# Patient Record
Sex: Female | Born: 1960 | Race: White | Hispanic: No | Marital: Married | Smoking: Current every day smoker
Health system: Southern US, Community
[De-identification: ages and names within clinical notes are randomized; demographics above are authoritative.]

## PROBLEM LIST (undated history)

## (undated) DIAGNOSIS — I10 Essential (primary) hypertension: Secondary | ICD-10-CM

## (undated) DIAGNOSIS — J302 Other seasonal allergic rhinitis: Secondary | ICD-10-CM

## (undated) DIAGNOSIS — K635 Polyp of colon: Secondary | ICD-10-CM

## (undated) DIAGNOSIS — K219 Gastro-esophageal reflux disease without esophagitis: Secondary | ICD-10-CM

## (undated) DIAGNOSIS — E039 Hypothyroidism, unspecified: Secondary | ICD-10-CM

## (undated) DIAGNOSIS — E079 Disorder of thyroid, unspecified: Secondary | ICD-10-CM

## (undated) HISTORY — PX: TUBAL LIGATION: SHX77

## (undated) HISTORY — PX: NECK SURGERY: SHX720

## (undated) HISTORY — DX: Disorder of thyroid, unspecified: E07.9

## (undated) HISTORY — PX: STOMACH SURGERY: SHX791

## (undated) HISTORY — PX: MIDDLE EAR SURGERY: SHX713

## (undated) HISTORY — PX: TONSILLECTOMY: SUR1361

## (undated) HISTORY — PX: MULTIPLE TOOTH EXTRACTIONS: SHX2053

## (undated) HISTORY — DX: Polyp of colon: K63.5

## (undated) HISTORY — PX: OTHER SURGICAL HISTORY: SHX169

## (undated) HISTORY — DX: Gastro-esophageal reflux disease without esophagitis: K21.9

---

## 2001-03-08 ENCOUNTER — Encounter: Payer: Self-pay | Admitting: Neurosurgery

## 2001-03-10 ENCOUNTER — Ambulatory Visit (HOSPITAL_COMMUNITY): Admission: RE | Admit: 2001-03-10 | Discharge: 2001-03-10 | Payer: Self-pay | Admitting: Neurosurgery

## 2001-03-10 ENCOUNTER — Encounter: Payer: Self-pay | Admitting: Neurosurgery

## 2001-04-18 ENCOUNTER — Ambulatory Visit (HOSPITAL_COMMUNITY): Admission: RE | Admit: 2001-04-18 | Discharge: 2001-04-18 | Payer: Self-pay | Admitting: Neurosurgery

## 2001-04-18 ENCOUNTER — Encounter: Payer: Self-pay | Admitting: Neurosurgery

## 2002-07-06 HISTORY — PX: CHOLECYSTECTOMY: SHX55

## 2002-08-24 ENCOUNTER — Ambulatory Visit (HOSPITAL_COMMUNITY): Admission: RE | Admit: 2002-08-24 | Discharge: 2002-08-24 | Payer: Self-pay | Admitting: Neurosurgery

## 2002-08-24 ENCOUNTER — Encounter: Payer: Self-pay | Admitting: Neurosurgery

## 2004-02-12 ENCOUNTER — Other Ambulatory Visit: Payer: Self-pay

## 2004-04-07 ENCOUNTER — Emergency Department: Payer: Self-pay | Admitting: Emergency Medicine

## 2004-05-13 ENCOUNTER — Ambulatory Visit: Payer: Self-pay | Admitting: Emergency Medicine

## 2008-01-02 ENCOUNTER — Ambulatory Visit: Payer: Self-pay

## 2008-01-02 ENCOUNTER — Other Ambulatory Visit: Payer: Self-pay

## 2009-07-22 ENCOUNTER — Ambulatory Visit: Payer: Self-pay | Admitting: Unknown Physician Specialty

## 2013-04-06 ENCOUNTER — Encounter: Payer: Self-pay | Admitting: Gastroenterology

## 2013-05-08 ENCOUNTER — Ambulatory Visit: Payer: Self-pay | Admitting: Gastroenterology

## 2013-08-06 HISTORY — PX: COLONOSCOPY: SHX174

## 2013-09-28 ENCOUNTER — Ambulatory Visit: Payer: Self-pay | Admitting: Unknown Physician Specialty

## 2013-11-07 ENCOUNTER — Ambulatory Visit: Payer: Self-pay | Admitting: Gastroenterology

## 2013-11-30 DIAGNOSIS — R198 Other specified symptoms and signs involving the digestive system and abdomen: Secondary | ICD-10-CM | POA: Insufficient documentation

## 2014-01-17 ENCOUNTER — Ambulatory Visit (INDEPENDENT_AMBULATORY_CARE_PROVIDER_SITE_OTHER): Payer: PRIVATE HEALTH INSURANCE | Admitting: General Surgery

## 2014-01-17 ENCOUNTER — Encounter: Payer: Self-pay | Admitting: General Surgery

## 2014-01-17 VITALS — BP 132/80 | HR 88 | Resp 12 | Ht 67.0 in | Wt 140.0 lb

## 2014-01-17 DIAGNOSIS — G8929 Other chronic pain: Secondary | ICD-10-CM

## 2014-01-17 DIAGNOSIS — R634 Abnormal weight loss: Secondary | ICD-10-CM

## 2014-01-17 DIAGNOSIS — R1011 Right upper quadrant pain: Secondary | ICD-10-CM

## 2014-01-17 NOTE — Patient Instructions (Addendum)
Patient to bring disc for Dr. Bary Castilla to review.   Patient has been scheduled for a UGI/SBFT at [facility] for 01-24-14 at 10:30 am (arrive 10 am). Prep: NPO after midnight. She is aware of date, time, and instructions.   This patient's surgery has been scheduled for 02-08-14 at [facility].

## 2014-01-17 NOTE — Progress Notes (Signed)
Patient ID: Tammy Mccall, female   DOB: [birth year], 53 y.o.   MRN: [id number]  Chief Complaint  Patient presents with  . Abdominal Pain    HPI Tammy Mccall is a 53 y.o. female here today for evaluation of right upper quadrant pain. She states this has been going on for six months now. Patient states is pain is constant. No appetite.Patient was seen in the ER at [facility] 12/30/13 had x-rays, ct of the abdomen and labs done. Patient states she has lost 70 pounds in the 6 months.   The patient was evaluated by Gaylyn Cheers, M.D. in March 2015. Results of that evaluation available today. Abdominal Pain    Past Medical History  Diagnosis Date  . Thyroid disease   . GERD (gastroesophageal reflux disease)     Past Surgical History  Procedure Laterality Date  . Cholecystectomy  2004  . Cesarean section  1990's  . Neck surgery    . Colonoscopy  08/2013  . Stomach surgery      No family history on file.  Social History History  Substance Use Topics  . Smoking status: Current Every Day Smoker -- 1.00 packs/day for 40 years    Types: Cigarettes  . Smokeless tobacco: Never Used  . Alcohol Use: Yes    No Known Allergies  Current Outpatient Prescriptions  Medication Sig Dispense Refill  . aluminum-magnesium hydroxide-simethicone (MAALOX) 034-742-59 MG/5ML SUSP Take by mouth.      . hyoscyamine (LEVSIN, ANASPAZ) 0.125 MG tablet Place 0.125 mg under the tongue every 4 (four) hours as needed.       Marland Kitchen levothyroxine (SYNTHROID, LEVOTHROID) 112 MCG tablet Take 112 mcg by mouth daily before breakfast.       No current facility-administered medications for this visit.    Review of Systems Review of Systems  Constitutional: Negative.  Negative for appetite change.  Respiratory: Negative.   Cardiovascular: Negative.   Gastrointestinal: Positive for abdominal pain. Negative for blood in stool, abdominal distention, anal bleeding and rectal pain.    Blood  pressure 132/80, pulse 88, resp. rate 12, height 5\' 7"  (1.702 m), weight 140 lb (63.504 kg). The patient's weight is down 22 pounds from her 2000 for evaluation of this office. Physical Exam Physical Exam  Constitutional: She is oriented to person, place, and time. She appears well-developed and well-nourished.  Eyes: Conjunctivae are normal. No scleral icterus.  Neck: Neck supple.  Cardiovascular: Normal rate, regular rhythm and normal heart sounds.   Pulmonary/Chest: Effort normal and breath sounds normal.  Abdominal: Soft. Normal appearance and bowel sounds are normal. There is tenderness in the right upper quadrant.  Neurological: She is alert and oriented to person, place, and time.  Skin: Skin is warm and dry.    Data Reviewed Plain films of the abdomen completed at [facility] on 12/30/2013 reported a normal bowel gas pattern but a 2.1 cm metallic density in the right lower quadrant. Bilateral occlusion devices for the fallopian tubes were identified. Chest x-ray the same date was unremarkable. ( Resolution clip placed at the time of colonoscopy).   REASON FOR EXAM:    abd pain RUQ epigastric weight loss COMMENTS:     PROCEDURE: KCT - KCT ABDOMEN/PELVIS W  - Sep 28 2013  3:03PM   CLINICAL DATA:  Right upper quadrant and epigastric pain, 30 pound weight loss in 6 weeks, prior esophageal surgery for GERD, cholecystectomy  EXAM: CT ABDOMEN AND PELVIS WITH CONTRAST  TECHNIQUE: Multidetector CT imaging  of the abdomen and pelvis was performed using the standard protocol following bolus administration of intravenous contrast. Sagittal and coronal MPR images reconstructed from axial data set. Patient extravasation of a small amount of contrast material after injection of 45 cc. The injection was terminated. Following replacement of the IV catheter, remaining contrast was administered.  CONTRAST:  Dilute oral contrast.  125 cc Isovue 370 IV.  COMPARISON:   None  FINDINGS: Minimal atelectasis at lung bases.  Mild fatty infiltration of liver. Gallbladder surgically absent.  Liver, spleen, pancreas, and kidneys normal appearance.  Bilateral diffuse thickening of the adrenal glands greater on right without definite discrete mass.  Normal size appendix containing a tiny at appendicolith.  Unremarkable uterus and ovaries with evidence of prior tubal ligation.  Normal appearing bladder and ureters.  Colon unopacified, suboptimally assessed, particularly right colon/ileocecal valve region.  Remaining bowel loops unremarkable.  Minimal thickening at gastroesophageal junction question related to prior anti reflux procedure.  No mass, adenopathy, free fluid or inflammatory process.  Scattered atherosclerotic calcifications.  No hernia or acute bone lesions.   IMPRESSION: No acute intra-abdominal or intrapelvic abnormalities. Bilateral low-attenuation adrenal gland thickening question hyperplasia without discrete mass.  Mild fatty infiltration of liver.  Ultrasound the abdomen dated 07/22/2009 completed the right upper quadrant pain showed a common bile duct measuring 4.3 cm, no evidence of intrahepatic biliary ductal dilatation, no choledocholithiasis, evidence of previous cholecystectomy   PCP notes from May-July 2015 reviewed.   Assessment    Abdominal pain, weight loss.     Plan    It's difficult to explain her weight loss and symptoms based on her clinical exam. The CT scan completed earlier this year did not show a proximal bowel dilatation. He would be an atypical presentation for scar tissue to cause massive weight loss without any bowel wall thickening.  Will attempt to locate all the burden records and consultation notes. A diagnostic laparoscopy has been offered with the idea that this may solve the issues of occult adhesions.       Patient has been scheduled for a UGI/SBFT at [facility] for 01-24-14 at 10:30 am  (arrive 10 am). Prep: NPO after midnight. She is aware of date, time, and instructions.   This patient's surgery has been scheduled for 02-08-14 at [facility].   PCP: Alexandria Lodge 01/18/2014, 4:33 PM

## 2014-01-18 ENCOUNTER — Encounter: Payer: Self-pay | Admitting: *Deleted

## 2014-01-18 DIAGNOSIS — R1011 Right upper quadrant pain: Principal | ICD-10-CM

## 2014-01-18 DIAGNOSIS — R634 Abnormal weight loss: Secondary | ICD-10-CM | POA: Insufficient documentation

## 2014-01-18 DIAGNOSIS — G8929 Other chronic pain: Secondary | ICD-10-CM | POA: Insufficient documentation

## 2014-01-24 ENCOUNTER — Ambulatory Visit: Payer: Self-pay | Admitting: General Surgery

## 2014-01-24 ENCOUNTER — Other Ambulatory Visit: Payer: Self-pay | Admitting: General Surgery

## 2014-01-24 ENCOUNTER — Encounter: Payer: Self-pay | Admitting: General Surgery

## 2014-01-24 DIAGNOSIS — R1011 Right upper quadrant pain: Principal | ICD-10-CM

## 2014-01-24 DIAGNOSIS — G8929 Other chronic pain: Secondary | ICD-10-CM

## 2014-01-31 ENCOUNTER — Telehealth: Payer: Self-pay

## 2014-01-31 NOTE — Telephone Encounter (Signed)
Notified patient as instructed, patient pleased. Discussed upcoming surgical procedure, patient agrees.

## 2014-01-31 NOTE — Telephone Encounter (Signed)
Message copied by Lesly Rubenstein on Wed Jan 31, 2014  8:20 AM ------      Message from: [place], Forest Gleason      Created: Tue Jan 30, 2014  8:39 PM       Notify patient UGI did not show any blockage.  Awaiting records from outside facilities.      Confirm the only intra-abdominal procedures were gallbladder removal and tubal ligation. Thanks.      ----- Message -----         From: Darrin Nipper, CMA         Sent: 01/24/2014   2:32 PM           To: Robert Bellow, MD                   ------

## 2014-02-08 ENCOUNTER — Ambulatory Visit: Payer: Self-pay | Admitting: General Surgery

## 2014-02-08 DIAGNOSIS — K660 Peritoneal adhesions (postprocedural) (postinfection): Secondary | ICD-10-CM

## 2014-02-12 ENCOUNTER — Encounter: Payer: Self-pay | Admitting: General Surgery

## 2014-02-13 ENCOUNTER — Telehealth: Payer: Self-pay | Admitting: *Deleted

## 2014-02-13 NOTE — Telephone Encounter (Signed)
I talked with the patient and encouraged her to use a heating pad. She was asking if she could walk around the house, advised that it was fine. She has been taking a stool softeners but stopped and she is going to start taking them again. appt is for 02-14-14 with Dr Bary Castilla.

## 2014-02-13 NOTE — Telephone Encounter (Signed)
Pt called and stated that she is still having pain from surgery on Thursday, she doesn't really want to take any pain medication, so she was just wondering what else could she do.

## 2014-02-14 ENCOUNTER — Ambulatory Visit (INDEPENDENT_AMBULATORY_CARE_PROVIDER_SITE_OTHER): Payer: Self-pay | Admitting: General Surgery

## 2014-02-14 ENCOUNTER — Encounter: Payer: Self-pay | Admitting: General Surgery

## 2014-02-14 VITALS — BP 120/78 | HR 72 | Resp 12 | Ht 67.0 in | Wt 135.0 lb

## 2014-02-14 DIAGNOSIS — R1011 Right upper quadrant pain: Secondary | ICD-10-CM

## 2014-02-14 DIAGNOSIS — G8929 Other chronic pain: Secondary | ICD-10-CM

## 2014-02-14 NOTE — Progress Notes (Signed)
Patient ID: Tammy Mccall, female   DOB: [birth year], 53 y.o.   MRN: [id number]  Chief Complaint  Patient presents with  . Routine Post Op    Laparoscopy and Lysis of Adhesions    HPI Tammy Mccall is a 53 y.o. female here today for her post op Laparoscopy and lysis of adhesions done on 02/08/14. Patient states she is doing well. Just still sore.   HPI  Past Medical History  Diagnosis Date  . Thyroid disease   . GERD (gastroesophageal reflux disease)     Past Surgical History  Procedure Laterality Date  . Cholecystectomy  2004  . Cesarean section  1990's  . Neck surgery    . Colonoscopy  08/2013  . Stomach surgery      No family history on file.  Social History History  Substance Use Topics  . Smoking status: Current Every Day Smoker -- 1.00 packs/day for 40 years    Types: Cigarettes  . Smokeless tobacco: Never Used  . Alcohol Use: Yes    No Known Allergies  Current Outpatient Prescriptions  Medication Sig Dispense Refill  . aluminum-magnesium hydroxide-simethicone (MAALOX) 211-155-20 MG/5ML SUSP Take by mouth.      . hyoscyamine (LEVSIN, ANASPAZ) 0.125 MG tablet Place 0.125 mg under the tongue every 4 (four) hours as needed.       Marland Kitchen levothyroxine (SYNTHROID, LEVOTHROID) 112 MCG tablet Take 112 mcg by mouth daily before breakfast.       No current facility-administered medications for this visit.    Review of Systems Review of Systems  Constitutional: Negative.   Respiratory: Negative.   Cardiovascular: Negative.     Blood pressure 120/78, pulse 72, resp. rate 12, height 5\' 7"  (1.702 m), weight 135 lb (61.236 kg). The patient's weight is down 5 pounds from prior visits. Physical Exam Physical Exam  Constitutional: She is oriented to person, place, and time. She appears well-developed and well-nourished.  Eyes: Conjunctivae are normal. No scleral icterus.  Neck: Neck supple.  Cardiovascular: Normal rate, regular rhythm and normal heart  sounds.   Pulmonary/Chest: Effort normal and breath sounds normal.  Abdominal: Soft. Normal appearance and bowel sounds are normal. There is no tenderness.  Port site are clean and healing well.   Neurological: She is alert and oriented to person, place, and time.  Skin: Skin is warm and dry.    Data Reviewed Intraoperative examination shows the abdomen to be free of adhesions except for a thin band from the omentum to the gallbladder fossa. These were taken down with cautery dissection.  Assessment    Chronic abdominal pain with weight loss. Focal area of intra-abdominal adhesions corresponding to the area of symptoms.     Plan    The patient will be increasing her diet as tolerated. We'll hopefully see improvement in her dietary tolerance (already better by family report).      PCP: Eliberto Ivory 02/15/2014, 9:22 PM

## 2014-02-14 NOTE — Patient Instructions (Signed)
The patient is aware to use an antiinflammatory of choice (Advil or Aleve) as needed for comfort. The patient is aware to use a heating pad as needed for comfort.

## 2014-02-20 ENCOUNTER — Telehealth: Payer: Self-pay | Admitting: *Deleted

## 2014-02-20 NOTE — Telephone Encounter (Signed)
The patient can discontinue the hyoscyamine.

## 2014-02-20 NOTE — Telephone Encounter (Signed)
Please schedule a appt for weight check at the end of the month

## 2014-02-20 NOTE — Telephone Encounter (Signed)
Pt called and was wondering does she need to keep taking the medication Hyoscyamine? She stated she seems to be doing better and is there any need to still take it.

## 2014-03-05 ENCOUNTER — Ambulatory Visit (INDEPENDENT_AMBULATORY_CARE_PROVIDER_SITE_OTHER): Payer: Self-pay | Admitting: *Deleted

## 2014-03-05 VITALS — Wt 136.0 lb

## 2014-03-05 DIAGNOSIS — R634 Abnormal weight loss: Secondary | ICD-10-CM

## 2014-03-05 NOTE — Progress Notes (Signed)
Weight up one pound.

## 2014-03-07 ENCOUNTER — Encounter: Payer: Self-pay | Admitting: General Surgery

## 2014-03-20 ENCOUNTER — Encounter: Payer: Self-pay | Admitting: General Surgery

## 2014-05-07 ENCOUNTER — Encounter: Payer: Self-pay | Admitting: General Surgery

## 2014-06-05 ENCOUNTER — Ambulatory Visit (INDEPENDENT_AMBULATORY_CARE_PROVIDER_SITE_OTHER): Payer: PRIVATE HEALTH INSURANCE | Admitting: General Surgery

## 2014-06-05 ENCOUNTER — Encounter: Payer: Self-pay | Admitting: General Surgery

## 2014-06-05 VITALS — BP 116/82 | HR 80 | Resp 18 | Ht 67.0 in | Wt 142.0 lb

## 2014-06-05 DIAGNOSIS — R1010 Upper abdominal pain, unspecified: Secondary | ICD-10-CM

## 2014-06-05 DIAGNOSIS — G8929 Other chronic pain: Secondary | ICD-10-CM

## 2014-06-05 DIAGNOSIS — R1011 Right upper quadrant pain: Principal | ICD-10-CM

## 2014-06-05 NOTE — Patient Instructions (Signed)
Patient advised to measure the amount of Maalox she takes versus sipping it. Patient to return as needed.

## 2014-06-05 NOTE — Progress Notes (Signed)
Patient ID: Atlas Kuc, female   DOB: [birth year], 53 y.o.   MRN: [id number]  Chief Complaint  Patient presents with  . Abdominal Pain    HPI Enyla Lisbon is a 53 y.o. female here today for a evaluation of abdominal pain. She states the pain is in her upper right quadrant. Patient had  Laparoscopy and lysis of adhesions done on 02/08/14. The patient states she has bloating after eating as well a gas. She also states she has had diarrhea for 9 months. She has 3-4 loose bowel movements that are yellow in color. If she eats she is more likely to move her bowels.  In spite of the reported frequency of stools after meals, her weight is up 7 pounds from her exam 3 months ago.  The severe pain in the right upper quadrant she was experiencing prior to her diagnostic laparoscopy and lysis of an isolated adhesion in the bed of the gallbladder has resolved. HPI  Past Medical History  Diagnosis Date  . Thyroid disease   . GERD (gastroesophageal reflux disease)     Past Surgical History  Procedure Laterality Date  . Cholecystectomy  2004  . Cesarean section  1990's  . Neck surgery    . Colonoscopy  08/2013  . Stomach surgery      No family history on file.  Social History History  Substance Use Topics  . Smoking status: Current Every Day Smoker -- 1.00 packs/day for 40 years    Types: Cigarettes  . Smokeless tobacco: Never Used  . Alcohol Use: 0.0 oz/week    0 Not specified per week    No Known Allergies  Current Outpatient Prescriptions  Medication Sig Dispense Refill  . aluminum-magnesium hydroxide-simethicone (MAALOX) 388-828-00 MG/5ML SUSP Take by mouth.    . hyoscyamine (LEVSIN, ANASPAZ) 0.125 MG tablet Place 0.125 mg under the tongue every 4 (four) hours as needed.     Marland Kitchen levothyroxine (SYNTHROID, LEVOTHROID) 112 MCG tablet Take 112 mcg by mouth daily before breakfast.    . Probiotic Product (PROBIOTIC DAILY PO) Take 1 tablet by mouth daily.     No current  facility-administered medications for this visit.    Review of Systems Review of Systems  Constitutional: Negative.   Respiratory: Negative.   Cardiovascular: Negative.   Gastrointestinal: Positive for abdominal pain and diarrhea.    Blood pressure 116/82, pulse 80, resp. rate 18, height 5\' 7"  (1.702 m), weight 142 lb (64.411 kg).  Physical Exam Physical Exam  Constitutional: She is oriented to person, place, and time. She appears well-developed and well-nourished.  Cardiovascular: Normal rate, regular rhythm and normal heart sounds.   No murmur heard. Pulmonary/Chest: Effort normal and breath sounds normal.  Abdominal: Soft. Normal appearance and bowel sounds are normal. There is no hepatosplenomegaly. There is no tenderness.  Neurological: She is alert and oriented to person, place, and time.  Skin: Skin is warm and dry.    Data Reviewed No new data.  Assessment    Loose stools secondary to magnesium based antacids.    Plan    The patient was discouraged from taking and "swig" of Maalox before and after every meal. I think this will help resolve her dietary/postprandial stool frequency.   PCP:  Truitt Leep 06/06/2014, 9:09 PM

## 2014-10-27 NOTE — Op Note (Signed)
PATIENT NAME:  Tammy Mccall, Tammy Mccall MR#:  [id number] DATE OF BIRTH:  06-07-1961  DATE OF PROCEDURE:  02/08/2014  PREOPERATIVE DIAGNOSIS: Right upper quadrant pain with profound weight loss.   POSTOPERATIVE DIAGNOSIS: Right upper quadrant pain with profound weight loss with adhesions to the gallbladder bed.   OPERATIVE PROCEDURE: Diagnostic laparoscopy and lysis of adhesions.   SURGEON: Hervey Ard, MD.   ANESTHESIA: General endotracheal under Lorane Gell, MD.   ESTIMATED BLOOD LOSS: None.   CLINICAL NOTE: This 54 year old woman has had a 24-month history of progressive pain in the right upper quadrant associated with profound weight loss. Imaging studies to date have been negative. She was brought to the operating room for planned diagnostic laparoscopy.   OPERATIVE NOTE: With the patient under adequate general endotracheal anesthesia, the abdomen was prepped with ChloraPrep and draped. In Trendelenburg position, a Veress needle was placed through a transumbilical incision. After assuring intra-abdominal location with the hanging drop test, the abdomen was insufflated with CO2 at 10 mmHg pressure. A 5 mm step port was expanded and inspection showed no evidence of injury from initial port placement. A 30 degree, 5 mm scope was used for laparoscopy.   The abdomen was entirely free of adhesions in spite of her multiple previous abdominal procedures with the exception of a band of omental tissue that was adherent to the gallbladder bed. Images of the pelvis, terminal ileum, liver, stomach, and hypogastrium were obtained. Due to the location of the patient's pain being in the area of the only visible adhesive disease, these were taken down with cautery dissection. Good hemostasis was noted. No evidence of intestinal injury. No evidence of bowel inflammation or dilatation. Normal vasculature appreciated. The right ureter was identified.   The patient tolerated the procedure well. The abdomen was  desufflated under direct vision and ports removed. Skin incisions were closed with 4-0 Vicryl subcuticular suture. Benzoin, Steri-Strips, Telfa, and Tegaderm dressings were applied.   The patient tolerated the procedure well and was taken to the recovery room in stable condition.    ____________________________ Robert Bellow, MD jwb:nb D: 02/09/2014 08:20:16 ET T: 02/09/2014 11:30:35 ET JOB#: [id number]  cc: Robert Bellow, MD, <Dictator> Tommy Medal, MD [name] MD ELECTRONICALLY SIGNED 02/09/2014 21:18

## 2015-01-01 ENCOUNTER — Ambulatory Visit
Admission: RE | Admit: 2015-01-01 | Discharge: 2015-01-01 | Disposition: A | Discharge status: Home / Self Care | Payer: PRIVATE HEALTH INSURANCE | Source: Ambulatory Visit | Attending: Family Medicine | Admitting: Family Medicine

## 2015-01-01 ENCOUNTER — Other Ambulatory Visit: Payer: Self-pay | Admitting: Family Medicine

## 2015-01-01 DIAGNOSIS — R1031 Right lower quadrant pain: Secondary | ICD-10-CM

## 2015-01-01 DIAGNOSIS — E279 Disorder of adrenal gland, unspecified: Secondary | ICD-10-CM | POA: Diagnosis not present

## 2015-01-01 DIAGNOSIS — I700 Atherosclerosis of aorta: Secondary | ICD-10-CM | POA: Insufficient documentation

## 2015-01-01 MED ORDER — IOHEXOL 300 MG/ML  SOLN
100.0000 mL | Freq: Once | INTRAMUSCULAR | Status: AC | PRN
  Administered 2015-01-01: 100 mL via INTRAVENOUS

## 2015-06-03 ENCOUNTER — Ambulatory Visit: Payer: PRIVATE HEALTH INSURANCE | Admitting: General Surgery

## 2015-06-13 ENCOUNTER — Ambulatory Visit (INDEPENDENT_AMBULATORY_CARE_PROVIDER_SITE_OTHER): Payer: PRIVATE HEALTH INSURANCE | Admitting: General Surgery

## 2015-06-13 ENCOUNTER — Encounter: Payer: Self-pay | Admitting: General Surgery

## 2015-06-13 VITALS — BP 130/74 | HR 74 | Resp 12 | Ht 67.0 in | Wt 156.0 lb

## 2015-06-13 DIAGNOSIS — R1010 Upper abdominal pain, unspecified: Secondary | ICD-10-CM | POA: Diagnosis not present

## 2015-06-13 DIAGNOSIS — G8929 Other chronic pain: Secondary | ICD-10-CM | POA: Diagnosis not present

## 2015-06-13 DIAGNOSIS — R1011 Right upper quadrant pain: Principal | ICD-10-CM

## 2015-06-13 NOTE — Patient Instructions (Signed)
Patient to return as needed. 

## 2015-06-13 NOTE — Progress Notes (Signed)
Patient ID: Tammy Mccall, female   DOB: [birth year], 54 y.o.   MRN: [id number]  Chief Complaint  Patient presents with  . Abdominal Pain    HPI Tammy Mccall is a 54 y.o. female here today for a recheck of abdominal pain. She states the pain is in her upper right quadrant, fairly infrequent and much less severe than in the past.. Patient had Laparoscopy and lysis of adhesions done on 02/08/14.  She also states her diarrhea is better. She has daily bowel movements. No rectal bleeding. No mucus or slime in the stools.   I personally reviewed the patient's history. HPI  Past Medical History  Diagnosis Date  . Thyroid disease   . GERD (gastroesophageal reflux disease)     Past Surgical History  Procedure Laterality Date  . Cholecystectomy  2004  . Cesarean section  1990's  . Neck surgery    . Colonoscopy  08/2013  . Stomach surgery      No family history on file.  Social History Social History  Substance Use Topics  . Smoking status: Current Every Day Smoker -- 1.00 packs/day for 40 years    Types: Cigarettes  . Smokeless tobacco: Never Used  . Alcohol Use: 0.0 oz/week    0 Standard drinks or equivalent per week    No Known Allergies  Current Outpatient Prescriptions  Medication Sig Dispense Refill  . hyoscyamine (LEVSIN, ANASPAZ) 0.125 MG tablet Place 0.125 mg under the tongue every 4 (four) hours as needed.     Marland Kitchen levothyroxine (SYNTHROID, LEVOTHROID) 112 MCG tablet Take 112 mcg by mouth daily before breakfast.    . Probiotic Product (PROBIOTIC DAILY PO) Take 1 tablet by mouth daily.    Marland Kitchen aluminum-magnesium hydroxide-simethicone (MAALOX) [id number] MG/5ML SUSP Take by mouth.     No current facility-administered medications for this visit.    Review of Systems Review of Systems  Blood pressure 130/74, pulse 74, resp. rate 12, height 5\' 7"  (1.702 m), weight 156 lb (70.761 kg).  Weight at the December 2015 exam: 142 pounds.  Physical Exam Physical  Exam  Constitutional: She is oriented to person, place, and time. She appears well-developed and well-nourished.  Eyes: Conjunctivae are normal. No scleral icterus.  Neck: Neck supple.    Cardiovascular: Normal rate, regular rhythm and normal heart sounds.   Pulmonary/Chest: Effort normal and breath sounds normal.  Abdominal: Soft. Normal appearance and bowel sounds are normal.  Lymphadenopathy:    She has no cervical adenopathy.  Neurological: She is alert and oriented to person, place, and time.  Skin: Skin is warm and dry.    Data Reviewed Diagnostic laparoscopy from 2015, lysis of adhesions.   Assessment    Benign abdominal exam.  Benign facial mole.      Plan    The patient's reported intermittent discomfort have not precluded adequate weight gain or limited activities. No intervention is required at this time.   Patient to return as needed   PCP:  Truitt Leep 06/14/2015, 6:32 AM

## 2016-06-01 IMAGING — CR DG ABDOMEN ACUTE W/ 1V CHEST
1 series · 3 of 3 positions shown · non-contrast
Comparison: KUB November 07, 2013

CLINICAL DATA: Right upper and lower quadrant abdominal pain;
history of previous abdominal surgery to relieve adhesions ; history
of cholecystectomy, chronic tobacco use.

EXAM:
DG ABDOMEN ACUTE W/ 1V CHEST

[Series 1: w chest pa · 0.14mm/px · 3 of 3 slices shown]
[im 1/3]
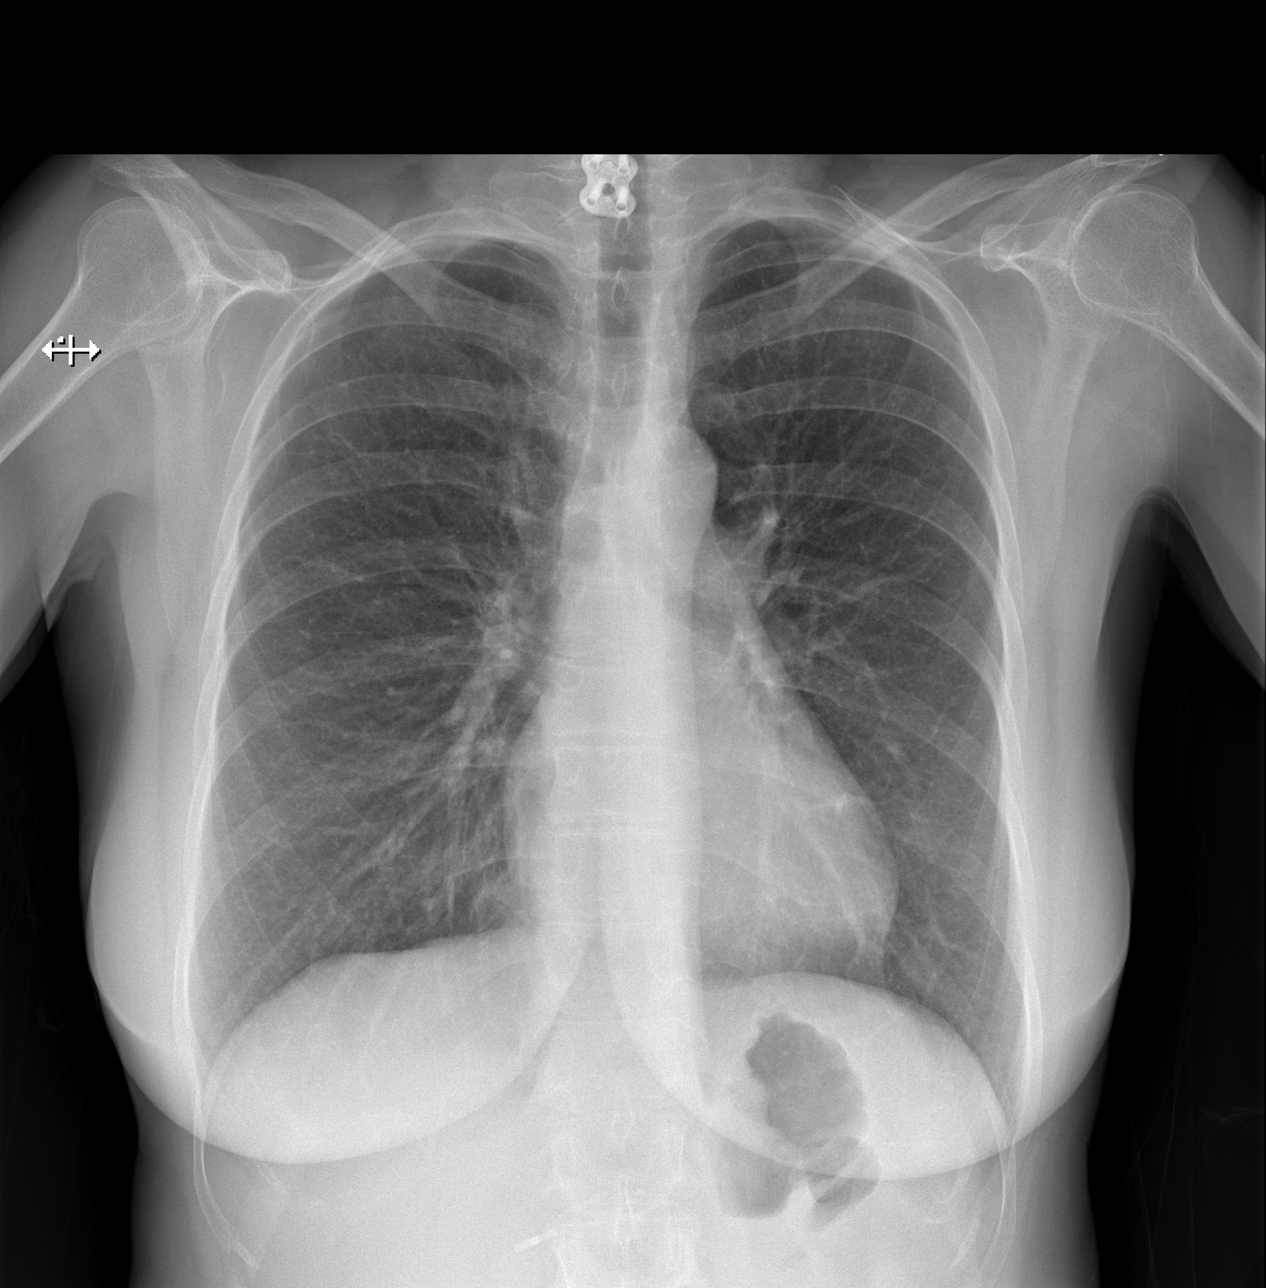
[im 2/3]
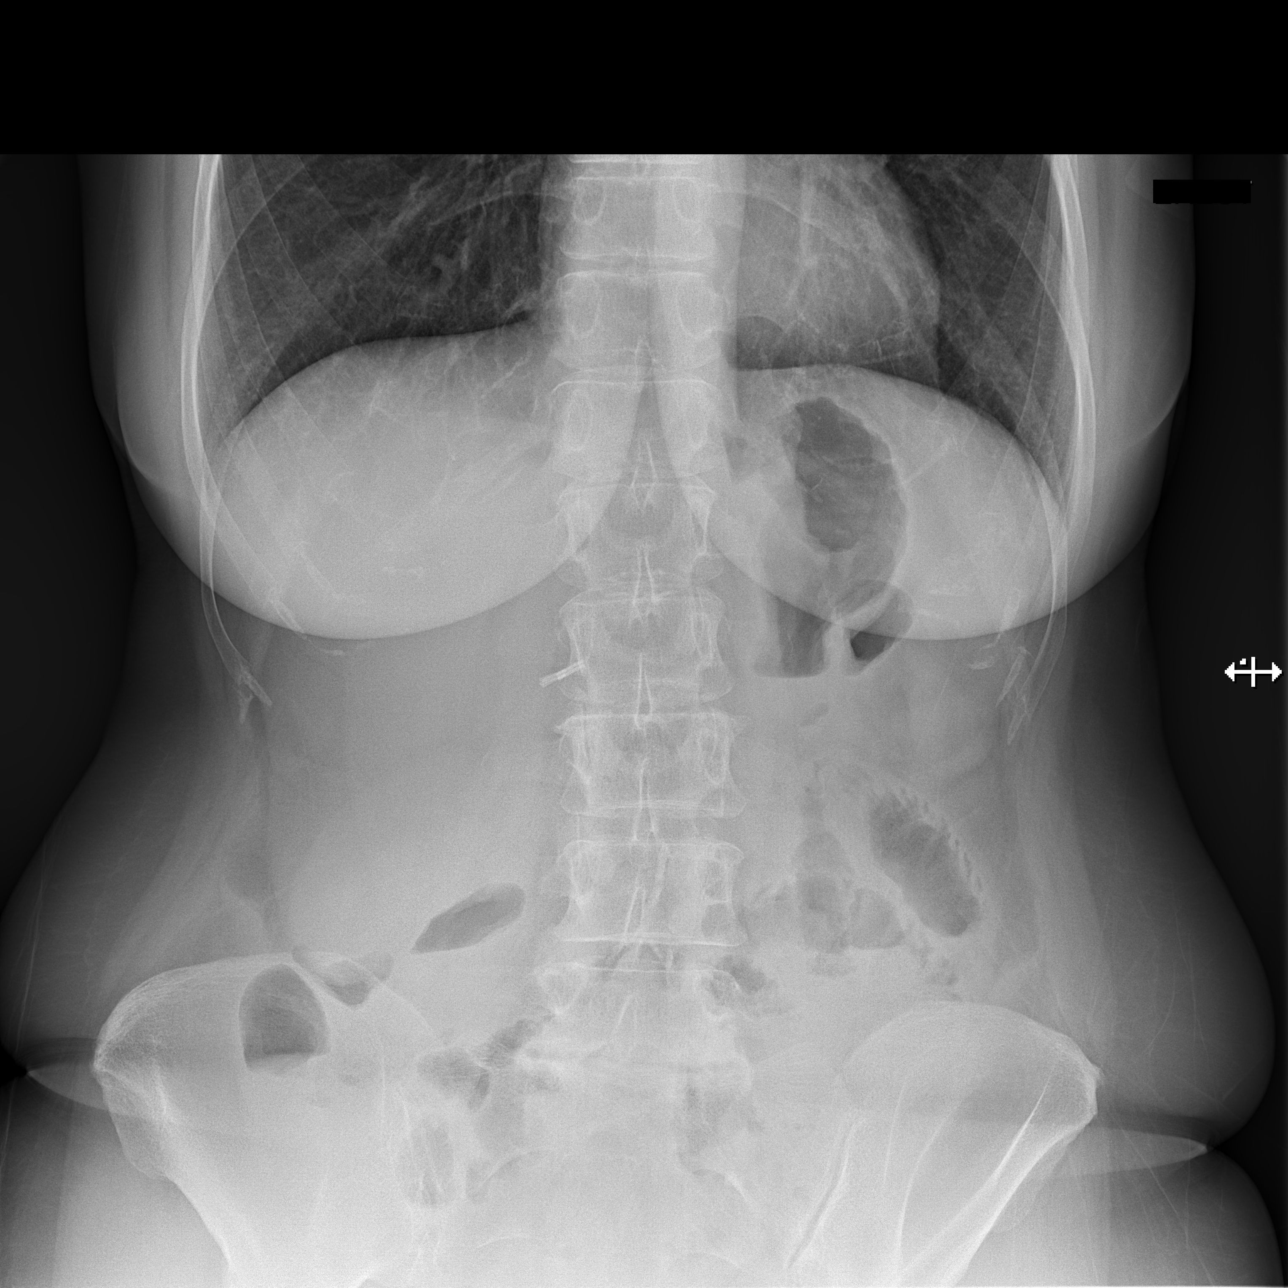
[im 3/3]
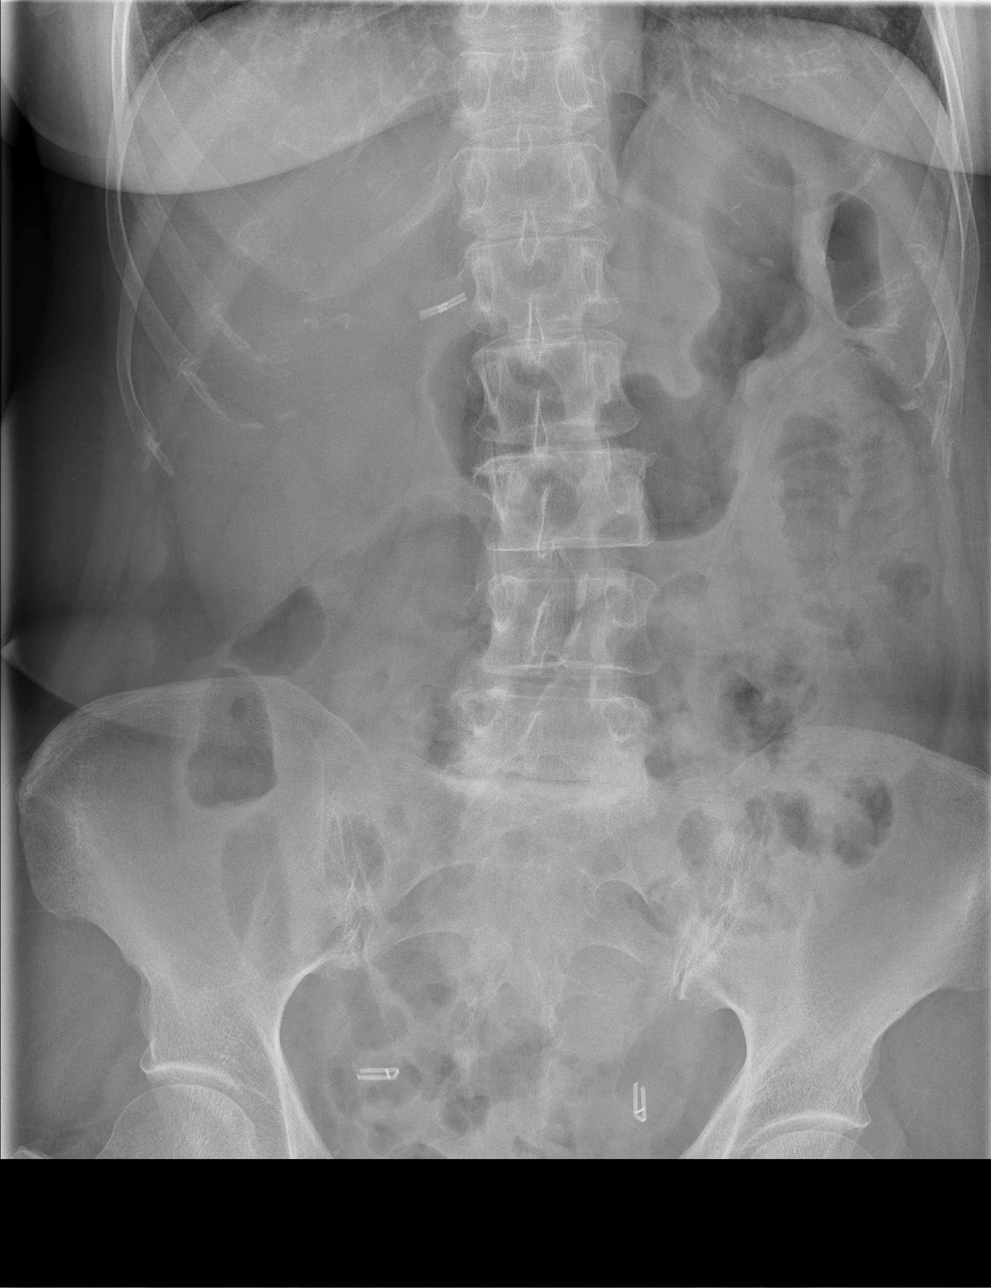

[3 of 3 positions shown; findings below may reference images not displayed]

FINDINGS: The lungs are well-expanded and clear. The heart and mediastinal
structures are normal. There is no pleural effusion. The bony thorax
exhibits no acute abnormalities.

Within the abdomen there are loops of minimally distended gas-filled
small bowel in the mid and left aspects of the abdomen. There is gas
within the stomach. The colonic gas and stool pattern is
unremarkable. There surgical clips in the gallbladder fossa and
there tubal ligation clips in the pelvis. There is degenerative disc
change at L5-S1.
IMPRESSION: The bowel gas pattern is consistent with a mild ileus. No
obstructive pattern is observed currently. If the patient's symptoms
warrant further evaluation, abdominal and pelvic CT scanning would
be useful.

## 2016-06-01 IMAGING — CT CT ABD-PELV W/ CM
2 of 5 series · 16 of 46 positions shown, 18 images · IV contrast (omnipaque)
Comparison: None.

CLINICAL DATA: Right lower quadrant abdominal pain.

EXAM:
CT ABDOMEN AND PELVIS WITH CONTRAST
TECHNIQUE: Multidetector CT imaging of the abdomen and pelvis was performed
using the standard protocol following bolus administration of
intravenous contrast.
CONTRAST:  100mL OMNIPAQUE IOHEXOL 300 MG/ML  SOLN

[Series 2: routine with · axial · 0.65mm/px · z∈[-1050,-665]mm · 13 of 87 slices shown, 15 images]
[im 5/87  soft-tissue]
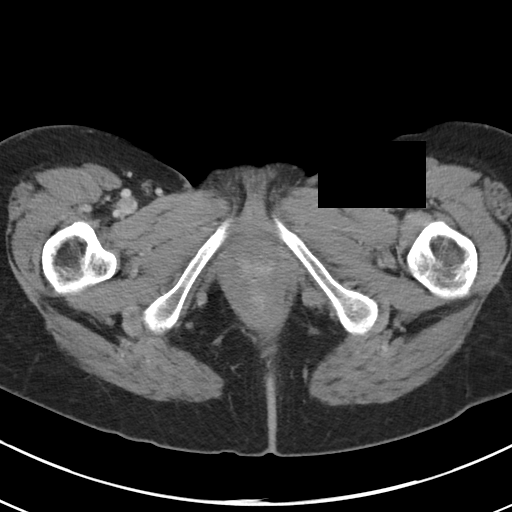
[im 5/87  bone]
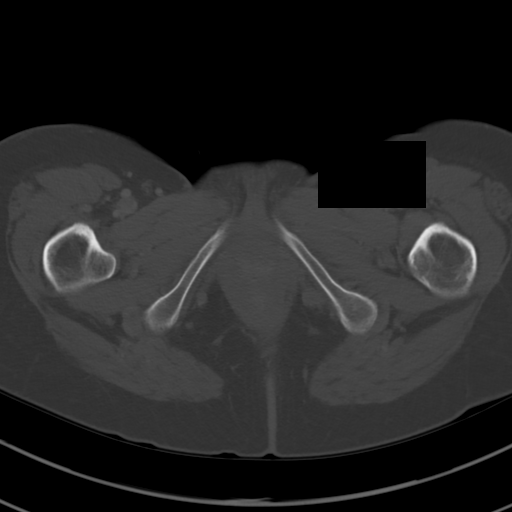
[im 10/87  soft-tissue]
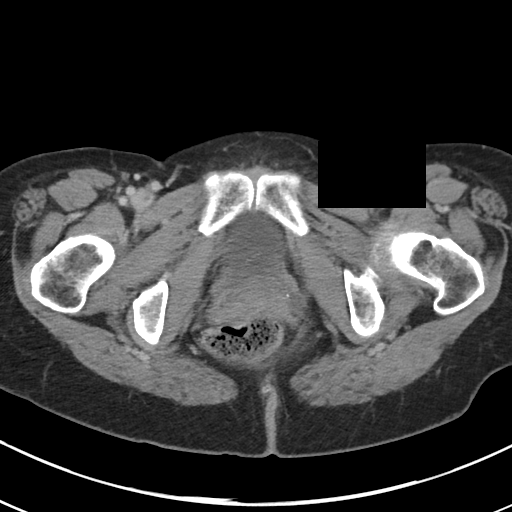
[im 20/87  soft-tissue]
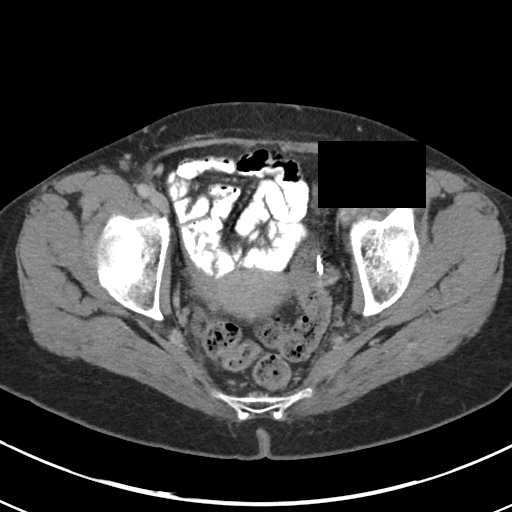
[im 24/87  soft-tissue]
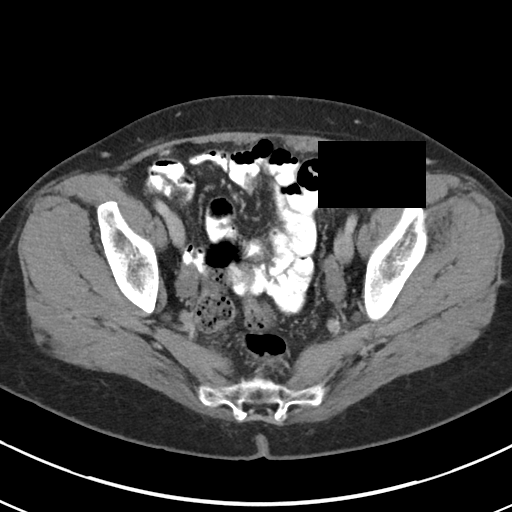
[im 29/87  soft-tissue]
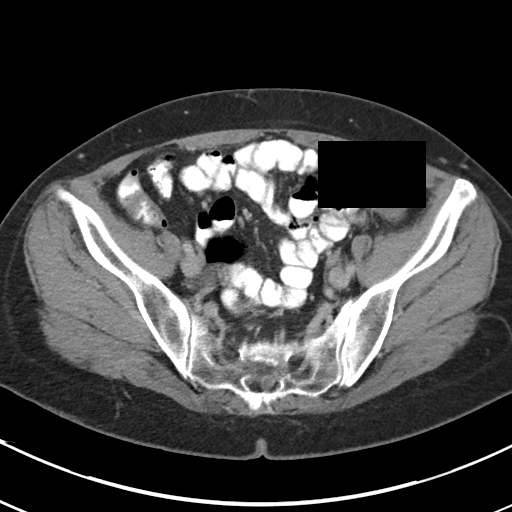
[im 39/87  soft-tissue]
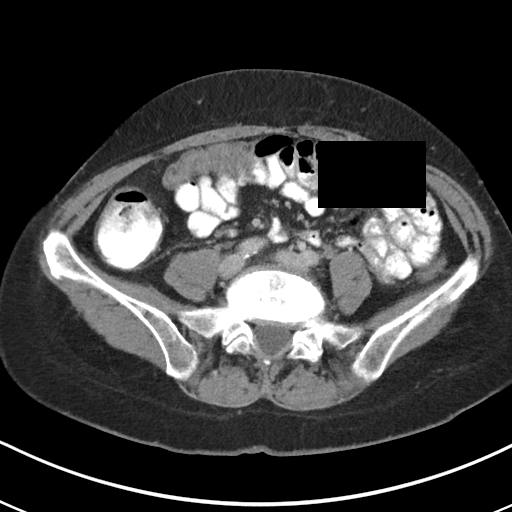
[im 44/87  soft-tissue]
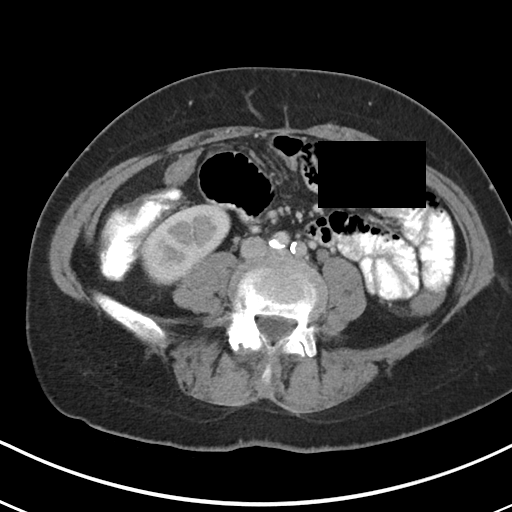
[im 48/87  soft-tissue]
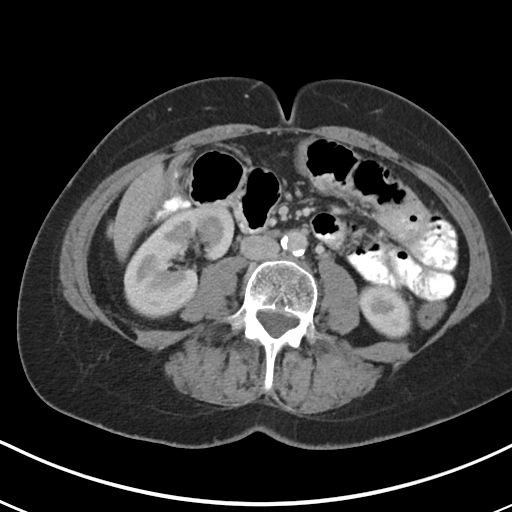
[im 58/87  soft-tissue]
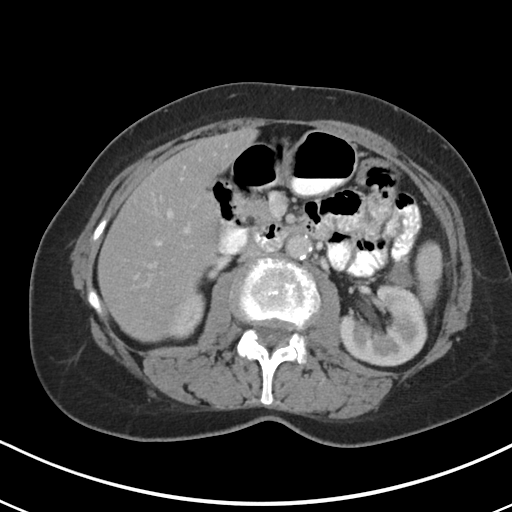
[im 58/87  bone]
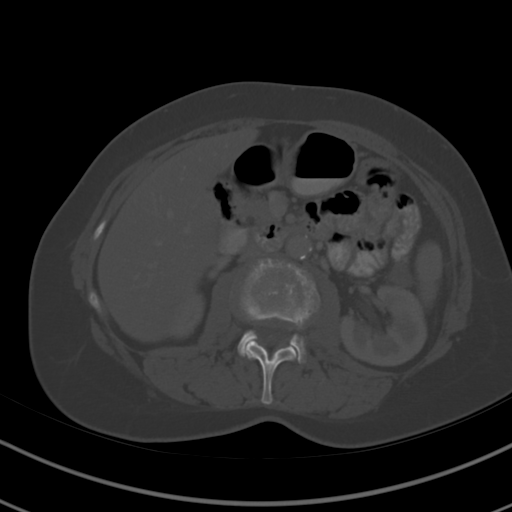
[im 63/87  soft-tissue]
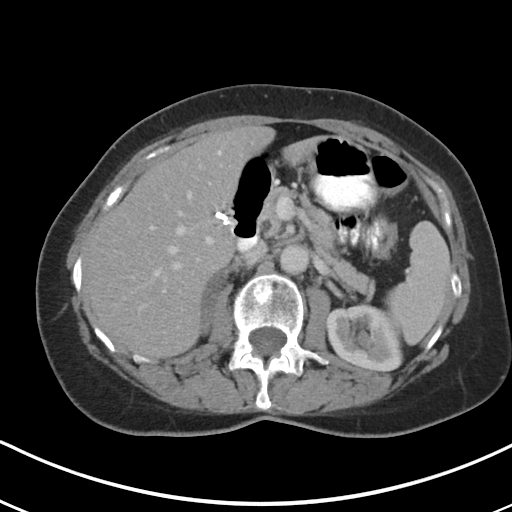
[im 67/87  soft-tissue]
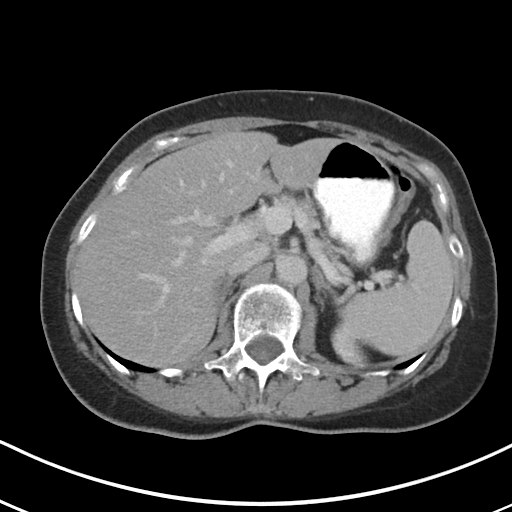
[im 77/87  soft-tissue]
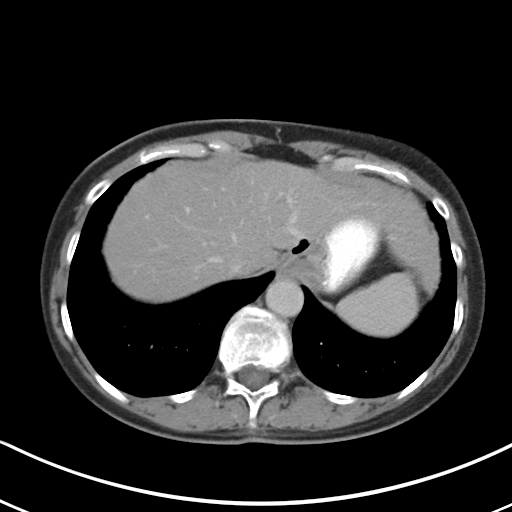
[im 82/87  soft-tissue]
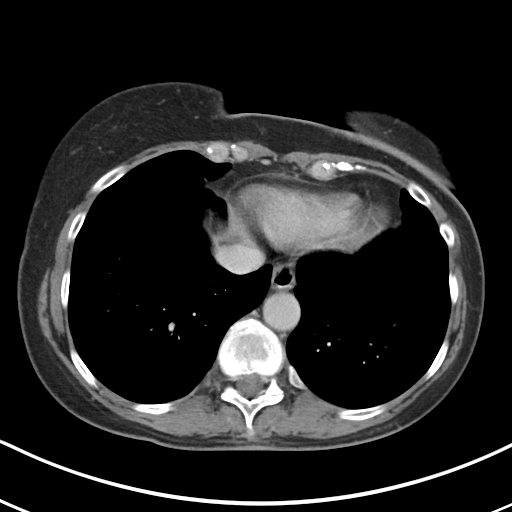

[Series 5: cor routine with · coronal · 0.66mm/px · 3 of 131 slices shown]
[im 44/131  soft-tissue]
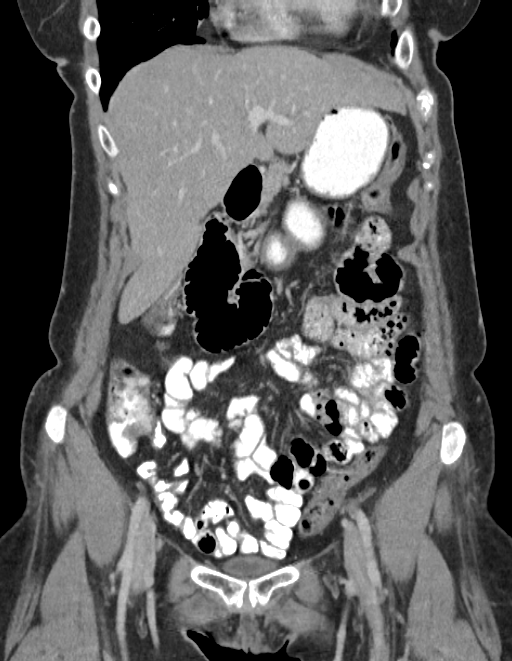
[im 58/131  soft-tissue]
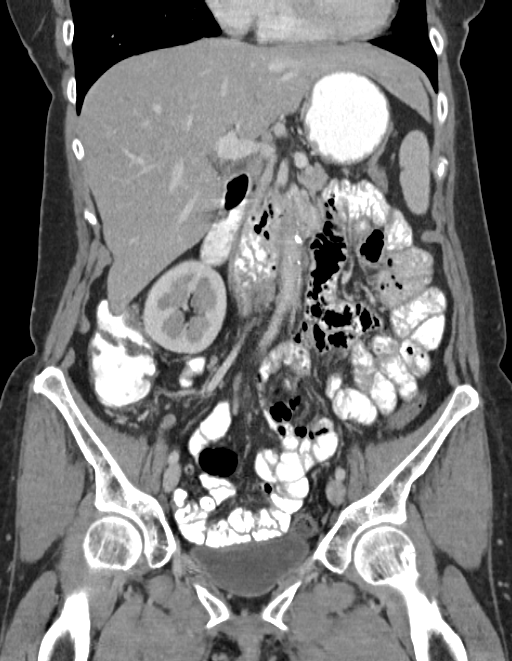
[im 73/131  soft-tissue]
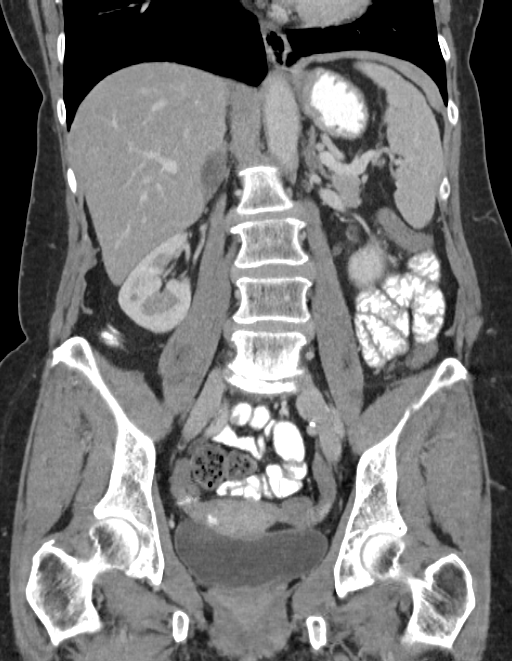

[16 of 46 positions shown; findings below may reference images not displayed]

FINDINGS: Multilevel degenerative disc disease is noted in the lumbar spine.
Visualized lung bases appear normal.

Status post cholecystectomy. The liver, spleen and pancreas appear
normal. 3.1 x 1.4 cm low density right adrenal mass is noted with
average Hounsfield measurement 16 on immediate postcontrast images
and less than 0 on delayed post-contrast images. Left adrenal gland
and kidneys appear normal. No hydronephrosis or renal obstruction is
noted. Atherosclerosis of abdominal aorta is noted without aneurysm
formation. The appendix appears normal. There is no evidence of
bowel obstruction. No abnormal fluid collection is noted. Uterus and
ovaries appear normal. Urinary bladder appears normal. No
significant adenopathy is noted.
IMPRESSION: 3.1 cm low density right adrenal mass is noted most consistent with
benign adenoma.

Atherosclerosis of abdominal aorta is noted without aneurysm
formation.

Status post cholecystectomy.

No other abnormality seen in the abdomen or pelvis.

## 2016-08-06 ENCOUNTER — Ambulatory Visit: Payer: Self-pay | Admitting: Gastroenterology

## 2016-08-13 ENCOUNTER — Ambulatory Visit: Payer: Self-pay | Admitting: Gastroenterology

## 2016-08-17 ENCOUNTER — Encounter: Payer: Self-pay | Admitting: Gastroenterology

## 2016-08-17 ENCOUNTER — Ambulatory Visit (INDEPENDENT_AMBULATORY_CARE_PROVIDER_SITE_OTHER): Payer: BLUE CROSS/BLUE SHIELD | Admitting: Gastroenterology

## 2016-08-17 ENCOUNTER — Other Ambulatory Visit: Payer: Self-pay

## 2016-08-17 VITALS — BP 161/88 | HR 83 | Temp 98.1°F | Ht 67.0 in | Wt 166.0 lb

## 2016-08-17 DIAGNOSIS — R197 Diarrhea, unspecified: Secondary | ICD-10-CM | POA: Diagnosis not present

## 2016-08-17 DIAGNOSIS — G8929 Other chronic pain: Secondary | ICD-10-CM | POA: Diagnosis not present

## 2016-08-17 DIAGNOSIS — R1031 Right lower quadrant pain: Secondary | ICD-10-CM | POA: Diagnosis not present

## 2016-08-17 NOTE — Progress Notes (Signed)
Gastroenterology Consultation  Referring Provider:     Tommy Medal, MD Primary Care Physician:  Tommy Medal, MD Primary Gastroenterologist:  Dr. Allen Norris     Reason for Consultation:     Right-sided abdominal pain        HPI:   Tammy Mccall is a 56 y.o. y/o female referred for consultation & management of Right-sided abdominal pain by Dr. Tommy Medal, MD.  This patient comes in today with a report of right-sided abdominal pain.  The patient states that the abdominal pain is also in the suprapubic area and some on the left side.  The patient reports that she thinks it may be due to a clip that was placed in her colon after polyp was removed.  The patient's last colonoscopy shows that the patient had a polyp removed on the left side and there is no report of any clip seen at that time.  The patient denies any unexplained weight loss fevers chills nausea or vomiting.  The pain is worse when she moves.  It is also worse when she bends over.  It is not related to eating or drinking and she denies any other GI symptoms associated with her abdominal pain.  Past Medical History:  Diagnosis Date  . GERD (gastroesophageal reflux disease)   . Thyroid disease     Past Surgical History:  Procedure Laterality Date  . CESAREAN SECTION  1990's  . CHOLECYSTECTOMY  2004  . COLONOSCOPY  08/2013  . NECK SURGERY    . stomach surgery      Prior to Admission medications   Medication Sig Start Date End Date Taking? Authorizing Provider  aluminum-magnesium hydroxide-simethicone (MAALOX) [id number] MG/5ML SUSP Take by mouth.   Yes Historical Provider, MD  hyoscyamine (LEVSIN, ANASPAZ) 0.125 MG tablet Place 0.125 mg under the tongue every 4 (four) hours as needed.    Yes Historical Provider, MD  levothyroxine (SYNTHROID, LEVOTHROID) 50 MCG tablet  07/22/16  Yes Historical Provider, MD  Probiotic Product (PROBIOTIC DAILY PO) Take 1 tablet by mouth daily.   Yes Historical Provider, MD    levothyroxine (SYNTHROID, LEVOTHROID) 112 MCG tablet Take 112 mcg by mouth daily before breakfast.    Historical Provider, MD    Family History  Problem Relation Age of Onset  . Hashimoto's thyroiditis Mother   . Alzheimer's disease Mother   . Gallbladder disease Father      Social History  Substance Use Topics  . Smoking status: Current Every Day Smoker    Packs/day: 1.00    Years: 40.00    Types: Cigarettes  . Smokeless tobacco: Never Used  . Alcohol use 0.0 oz/week    Allergies as of 08/17/2016  . (No Known Allergies)    Review of Systems:    All systems reviewed and negative except where noted in HPI.   Physical Exam:  BP (!) 161/88   Pulse 83   Temp 98.1 F (36.7 C) (Oral)   Ht 5\' 7"  (1.702 m)   Wt 166 lb (75.3 kg)   BMI 26.00 kg/m  No LMP recorded. Patient is postmenopausal. Psych:  Alert and cooperative. Normal mood and affect. General:   Alert,  Well-developed, well-nourished, pleasant and cooperative in NAD Head:  Normocephalic and atraumatic. Eyes:  Sclera clear, no icterus.   Conjunctiva pink. Ears:  Normal auditory acuity. Nose:  No deformity, discharge, or lesions. Mouth:  No deformity or lesions,oropharynx pink & moist. Neck:  Supple; no masses or thyromegaly.  Lungs:  Respirations even and unlabored.  Clear throughout to auscultation.   No wheezes, crackles, or rhonchi. No acute distress. Heart:  Regular rate and rhythm; no murmurs, clicks, rubs, or gallops. Abdomen:  Normal bowel sounds.  No bruits.  Soft, Positive tenderness to one finger palpation while flexing the abdominal wall muscles and non-distended without masses, hepatosplenomegaly or hernias noted.  No guarding or rebound tenderness.  Positive Carnett sign.   Rectal:  Deferred.  Msk:  Symmetrical without gross deformities.  Good, equal movement & strength bilaterally. Pulses:  Normal pulses noted. Extremities:  No clubbing or edema.  No cyanosis. Neurologic:  Alert and oriented x3;   grossly normal neurologically. Skin:  Intact without significant lesions or rashes.  No jaundice. Lymph Nodes:  No significant cervical adenopathy. Psych:  Alert and cooperative. Normal mood and affect.  Imaging Studies: No results found.  Assessment and Plan:   Tammy Mccall is a 56 y.o. y/o female who comes in today with chronic abdominal pain.  The patient states that she also has back pain.  The patient's physical exam is consistent with musculoskeletal pain and while the abdomen is mildly tender on the right side wall the patient's abdomen is relax the pain intensifies greatly and the patient jumps when touched with one finger while flexing the abdominal wall muscles. The patient had one polyp 3 years ago with a recommendation of a repeat colonoscopy in 5 years after her last colonoscopy.  This makes her not due for the next 2 years.  The patient has been told that her abdominal pain is due to her muscle or skeletal system.  She also reports that she's had chronic diarrhea without any cause found on the colonoscopy.  The patient has been told to increase fiber in her diet and take Imodium as needed.    Lucilla Lame, MD. Marval Regal   Note: This dictation was prepared with Dragon dictation along with smaller phrase technology. Any transcriptional errors that result from this process are unintentional.

## 2017-06-16 ENCOUNTER — Ambulatory Visit
Admission: RE | Admit: 2017-06-16 | Discharge: 2017-06-16 | Disposition: A | Discharge status: Home / Self Care | Payer: BLUE CROSS/BLUE SHIELD | Source: Ambulatory Visit | Attending: Family Medicine | Admitting: Family Medicine

## 2017-06-16 ENCOUNTER — Ambulatory Visit: Admission: RE | Admit: 2017-06-16 | Payer: PRIVATE HEALTH INSURANCE | Source: Ambulatory Visit

## 2017-06-16 ENCOUNTER — Other Ambulatory Visit: Payer: Self-pay | Admitting: Family Medicine

## 2017-06-16 DIAGNOSIS — R319 Hematuria, unspecified: Secondary | ICD-10-CM

## 2017-06-16 DIAGNOSIS — I700 Atherosclerosis of aorta: Secondary | ICD-10-CM | POA: Insufficient documentation

## 2017-06-16 DIAGNOSIS — R109 Unspecified abdominal pain: Secondary | ICD-10-CM | POA: Diagnosis present

## 2017-07-19 ENCOUNTER — Telehealth: Payer: Self-pay

## 2017-07-19 NOTE — Telephone Encounter (Signed)
Pt called triage line stating she has been having off and on rectal bleeding x few months. She is not having any female related issues such as abdominal/pelvic pain and knows it is not from vagina. She had a Colonoscopy 3 years ago and it was normal. She has been to a GI doctor but was wanting to see if [facility] could referr her to one local. Pt aware RPH is out of the office and he or his nurse will call her back when he returns.

## 2017-07-19 NOTE — Telephone Encounter (Signed)
Task forwarded to [facility]

## 2017-07-20 NOTE — Telephone Encounter (Signed)
She saw Dr Allen Norris last Feb and he does have office in [place] (as well as [place] where she may have been seen last year).  She should return to see him here, unless she has a conflict there.  Since she has seen him she does not need referral.

## 2017-07-20 NOTE — Telephone Encounter (Signed)
Pt aware, via voicemail 

## 2018-03-08 ENCOUNTER — Other Ambulatory Visit: Payer: Self-pay | Admitting: Student

## 2018-03-08 DIAGNOSIS — G8929 Other chronic pain: Secondary | ICD-10-CM

## 2018-03-08 DIAGNOSIS — M545 Low back pain: Principal | ICD-10-CM

## 2018-04-12 ENCOUNTER — Other Ambulatory Visit: Payer: Self-pay | Admitting: Neurosurgery

## 2018-04-12 DIAGNOSIS — M5440 Lumbago with sciatica, unspecified side: Secondary | ICD-10-CM

## 2018-05-13 ENCOUNTER — Ambulatory Visit
Admission: RE | Admit: 2018-05-13 | Discharge: 2018-05-13 | Disposition: A | Discharge status: Home / Self Care | Payer: BLUE CROSS/BLUE SHIELD | Source: Ambulatory Visit | Attending: Neurosurgery | Admitting: Neurosurgery

## 2018-05-13 DIAGNOSIS — M5440 Lumbago with sciatica, unspecified side: Secondary | ICD-10-CM

## 2018-05-13 MED ORDER — DIAZEPAM 5 MG PO TABS
10.0000 mg | ORAL_TABLET | Freq: Once | ORAL | Status: AC
  Administered 2018-05-13: 5 mg via ORAL

## 2018-05-13 MED ORDER — IOHEXOL 180 MG/ML  SOLN
15.0000 mL | Freq: Once | INTRAMUSCULAR | Status: AC | PRN
  Administered 2018-05-13: 15 mL via INTRATHECAL

## 2018-05-13 NOTE — Discharge Instructions (Signed)

## 2018-11-25 ENCOUNTER — Other Ambulatory Visit: Payer: Self-pay | Admitting: Neurosurgery

## 2018-11-29 NOTE — Pre-Procedure Instructions (Signed)
Tammy Mccall  11/29/2018      [id number], [place] AT [facility] [address] [place] [address] Phone: [phone] Fax: [phone]    Your procedure is scheduled on Dec 02, 2018.  Report to [facility] Entrance "A" at 1145 AM.  Call this number if you have problems the morning of surgery:  [phone]   Remember:  Do not eat or drink after midnight.    Take these medicines the morning of surgery with A SIP OF WATER  Levothyroxine (synthroid)  7 days prior to surgery STOP taking any Aspirin (unless otherwise instructed by your surgeon), Aleve, Naproxen, Ibuprofen, Motrin, Advil, Goody's, BC's, all herbal medications, fish oil, and all vitamins    Do not wear jewelry, make-up or nail polish.  Do not wear lotions, powders, or perfumes, or deodorant.  Do not shave 48 hours prior to surgery.    Do not bring valuables to the hospital.  [facility] is not responsible for any belongings or valuables.  Contacts, dentures or bridgework may not be worn into surgery.  Leave your suitcase in the car.  After surgery it may be brought to your room.  For patients admitted to the hospital, discharge time will be determined by your treatment team.  Patients discharged the day of surgery will not be allowed to drive home.    [facility]- Preparing For Surgery  Before surgery, you can play an important role. Because skin is not sterile, your skin needs to be as free of germs as possible. You can reduce the number of germs on your skin by washing with CHG (chlorahexidine gluconate) Soap before surgery.  CHG is an antiseptic cleaner which kills germs and bonds with the skin to continue killing germs even after washing.    Oral Hygiene is also important to reduce your risk of infection.  Remember - BRUSH YOUR TEETH THE MORNING OF SURGERY WITH YOUR REGULAR TOOTHPASTE  Please do not use if you have an allergy to CHG or  antibacterial soaps. If your skin becomes reddened/irritated stop using the CHG.  Do not shave (including legs and underarms) for at least 48 hours prior to first CHG shower. It is OK to shave your face.  Please follow these instructions carefully.   1. Shower the NIGHT BEFORE SURGERY and the MORNING OF SURGERY with CHG.   2. If you chose to wash your hair, wash your hair first as usual with your normal shampoo.  3. After you shampoo, rinse your hair and body thoroughly to remove the shampoo.  4. Use CHG as you would any other liquid soap. You can apply CHG directly to the skin and wash gently with a scrungie or a clean washcloth.   5. Apply the CHG Soap to your body ONLY FROM THE NECK DOWN.  Do not use on open wounds or open sores. Avoid contact with your eyes, ears, mouth and genitals (private parts). Wash Face and genitals (private parts)  with your normal soap.  6. Wash thoroughly, paying special attention to the area where your surgery will be performed.  7. Thoroughly rinse your body with warm water from the neck down.  8. DO NOT shower/wash with your normal soap after using and rinsing off the CHG Soap.  9. Pat yourself dry with a CLEAN TOWEL.  10. Wear CLEAN PAJAMAS to bed the night before surgery, wear comfortable clothes  the morning of surgery  11. Place CLEAN SHEETS on your bed the night of your first shower and DO NOT SLEEP WITH PETS.  Day of Surgery:  Do not apply any deodorants/lotions.  Please wear clean clothes to the hospital/surgery center.   Remember to brush your teeth WITH YOUR REGULAR TOOTHPASTE.  Please read over the following fact sheets that you were given.

## 2018-11-30 ENCOUNTER — Other Ambulatory Visit (HOSPITAL_COMMUNITY)
Admission: RE | Admit: 2018-11-30 | Discharge: 2018-11-30 | Disposition: A | Discharge status: Home / Self Care | Payer: BLUE CROSS/BLUE SHIELD | Source: Ambulatory Visit | Attending: Neurosurgery | Admitting: Neurosurgery

## 2018-11-30 ENCOUNTER — Other Ambulatory Visit: Payer: Self-pay

## 2018-11-30 ENCOUNTER — Encounter (HOSPITAL_COMMUNITY): Payer: Self-pay

## 2018-11-30 ENCOUNTER — Encounter (HOSPITAL_COMMUNITY)
Admission: RE | Admit: 2018-11-30 | Discharge: 2018-11-30 | Disposition: A | Discharge status: Home / Self Care | Payer: BLUE CROSS/BLUE SHIELD | Source: Ambulatory Visit | Attending: Neurosurgery | Admitting: Neurosurgery

## 2018-11-30 DIAGNOSIS — Z01812 Encounter for preprocedural laboratory examination: Secondary | ICD-10-CM | POA: Insufficient documentation

## 2018-11-30 DIAGNOSIS — Z1159 Encounter for screening for other viral diseases: Secondary | ICD-10-CM | POA: Insufficient documentation

## 2018-11-30 HISTORY — DX: Essential (primary) hypertension: I10

## 2018-11-30 HISTORY — DX: Hypothyroidism, unspecified: E03.9

## 2018-11-30 HISTORY — DX: Other seasonal allergic rhinitis: J30.2

## 2018-11-30 LAB — CBC
HCT: 44.9 % (ref 36.0–46.0)
Hemoglobin: 15 g/dL (ref 12.0–15.0)
MCH: 33.3 pg (ref 26.0–34.0)
MCHC: 33.4 g/dL (ref 30.0–36.0)
MCV: 99.8 fL (ref 80.0–100.0)
Platelets: 238 10*3/uL (ref 150–400)
RBC: 4.5 MIL/uL (ref 3.87–5.11)
RDW: 13.2 % (ref 11.5–15.5)
WBC: 8.8 10*3/uL (ref 4.0–10.5)
nRBC: 0 % (ref 0.0–0.2)

## 2018-11-30 LAB — BASIC METABOLIC PANEL
Anion gap: 12 (ref 5–15)
BUN: 15 mg/dL (ref 6–20)
CO2: 25 mmol/L (ref 22–32)
Calcium: 9.2 mg/dL (ref 8.9–10.3)
Chloride: 104 mmol/L (ref 98–111)
Creatinine, Ser: 0.6 mg/dL (ref 0.44–1.00)
GFR calc Af Amer: >60 mL/min (ref >60)
GFR calc non Af Amer: >60 mL/min (ref >60)
Glucose, Bld: 106 mg/dL — ABNORMAL HIGH (ref 70–99)
Potassium: 3.7 mmol/L (ref 3.5–5.1)
Sodium: 141 mmol/L (ref 135–145)

## 2018-11-30 LAB — SURGICAL PCR SCREEN
MRSA, PCR: NEGATIVE
Staphylococcus aureus: NEGATIVE

## 2018-11-30 LAB — SARS CORONAVIRUS 2 BY RT PCR (HOSPITAL ORDER, PERFORMED IN ~~LOC~~ HOSPITAL LAB): SARS Coronavirus 2: NEGATIVE

## 2018-11-30 NOTE — Progress Notes (Addendum)
Patient informed of the Visitor Restriction Policy that is now currently in place.  Verbalizes understanding.  Patient denies SOB, fever, cough, sore throat.  PCP -  [facility] - Dr Windell Moment Cardiologist -  Denies  Chest x-ray - 12/01/2013 EKG -  11/30/18 Stress Test - Years ago in her 20's - Normal per patient.  [facility] ECHO - Denies Cardiac Cath - Denies  ICD Pacemaker/Loop-Denies  Sleep Study - Denies CPAP - N/A  Labs: CBC, BMP  Blood Thinner Instructions:Denies  ERAS:None ordered  Aspirin Instructions:Denies  Anesthesia review:  No    Coronavirus Screening Have you or your Husband Nicole Kindred experienced the following symptoms:  Cough yes/no: No Fever (>100.18F)  yes/no: No Runny nose yes/no: No Sore throat yes/no: No Difficulty breathing/shortness of breath  yes/no: No  Have you or a family member traveled in the last 14 days and where? yes/no: No  Patient verbalized understanding of instructions that were given to them at the PAT appointment. Patient was also instructed that they will need to review over the PAT instructions again at home before surgery.

## 2018-12-02 ENCOUNTER — Ambulatory Visit (HOSPITAL_COMMUNITY): Payer: BLUE CROSS/BLUE SHIELD

## 2018-12-02 ENCOUNTER — Ambulatory Visit (HOSPITAL_COMMUNITY): Payer: BLUE CROSS/BLUE SHIELD | Admitting: Certified Registered"

## 2018-12-02 ENCOUNTER — Encounter (HOSPITAL_COMMUNITY)
Admission: RE | Disposition: A | Discharge status: Home / Self Care | Payer: Self-pay | Source: Home / Self Care | Attending: Neurosurgery

## 2018-12-02 ENCOUNTER — Ambulatory Visit (HOSPITAL_COMMUNITY)
Admission: RE | Admit: 2018-12-02 | Discharge: 2018-12-03 | Disposition: A | Discharge status: Home / Self Care | Payer: BLUE CROSS/BLUE SHIELD | Attending: Neurosurgery | Admitting: Neurosurgery

## 2018-12-02 ENCOUNTER — Encounter (HOSPITAL_COMMUNITY): Payer: Self-pay | Admitting: Certified Registered"

## 2018-12-02 DIAGNOSIS — M5105 Intervertebral disc disorders with myelopathy, thoracolumbar region: Secondary | ICD-10-CM | POA: Diagnosis present

## 2018-12-02 DIAGNOSIS — F17210 Nicotine dependence, cigarettes, uncomplicated: Secondary | ICD-10-CM | POA: Insufficient documentation

## 2018-12-02 DIAGNOSIS — E039 Hypothyroidism, unspecified: Secondary | ICD-10-CM | POA: Diagnosis not present

## 2018-12-02 DIAGNOSIS — M48061 Spinal stenosis, lumbar region without neurogenic claudication: Secondary | ICD-10-CM | POA: Insufficient documentation

## 2018-12-02 DIAGNOSIS — Z79899 Other long term (current) drug therapy: Secondary | ICD-10-CM | POA: Diagnosis not present

## 2018-12-02 DIAGNOSIS — M5116 Intervertebral disc disorders with radiculopathy, lumbar region: Secondary | ICD-10-CM | POA: Insufficient documentation

## 2018-12-02 DIAGNOSIS — K219 Gastro-esophageal reflux disease without esophagitis: Secondary | ICD-10-CM | POA: Diagnosis not present

## 2018-12-02 DIAGNOSIS — M5126 Other intervertebral disc displacement, lumbar region: Secondary | ICD-10-CM | POA: Diagnosis present

## 2018-12-02 DIAGNOSIS — I10 Essential (primary) hypertension: Secondary | ICD-10-CM | POA: Diagnosis not present

## 2018-12-02 DIAGNOSIS — Z79890 Hormone replacement therapy (postmenopausal): Secondary | ICD-10-CM | POA: Diagnosis not present

## 2018-12-02 DIAGNOSIS — Z419 Encounter for procedure for purposes other than remedying health state, unspecified: Secondary | ICD-10-CM

## 2018-12-02 HISTORY — PX: THORACIC DISCECTOMY: SHX6113

## 2018-12-02 SURGERY — THORACIC DISCECTOMY
Anesthesia: General | Site: Back | Laterality: Right

## 2018-12-02 MED ORDER — PROPOFOL 10 MG/ML IV BOLUS
INTRAVENOUS | Status: DC | PRN
  Administered 2018-12-02: 150 mg via INTRAVENOUS

## 2018-12-02 MED ORDER — PHENYLEPHRINE 40 MCG/ML (10ML) SYRINGE FOR IV PUSH (FOR BLOOD PRESSURE SUPPORT)
PREFILLED_SYRINGE | INTRAVENOUS | Status: AC
  Filled 2018-12-02: qty 10

## 2018-12-02 MED ORDER — LIDOCAINE 2% (20 MG/ML) 5 ML SYRINGE
INTRAMUSCULAR | Status: DC | PRN
  Administered 2018-12-02: 100 mg via INTRAVENOUS

## 2018-12-02 MED ORDER — ALUM & MAG HYDROXIDE-SIMETH 200-200-20 MG/5ML PO SUSP
15.0000 mL | Freq: Every day | ORAL | Status: DC
  Administered 2018-12-02: 15 mL via ORAL
  Filled 2018-12-02: qty 30

## 2018-12-02 MED ORDER — PROMETHAZINE HCL 25 MG/ML IJ SOLN: 6.2500 mg | INTRAMUSCULAR | Status: DC | PRN

## 2018-12-02 MED ORDER — CHLORHEXIDINE GLUCONATE CLOTH 2 % EX PADS: 6.0000 | MEDICATED_PAD | Freq: Once | CUTANEOUS | Status: DC

## 2018-12-02 MED ORDER — THROMBIN 20000 UNITS EX SOLR
CUTANEOUS | Status: DC | PRN
  Administered 2018-12-02: 17:00:00 via TOPICAL

## 2018-12-02 MED ORDER — ACETAMINOPHEN 325 MG PO TABS: 650.0000 mg | ORAL_TABLET | ORAL | Status: DC | PRN

## 2018-12-02 MED ORDER — EMERGEN-C IMMUNE PO PACK: PACK | Freq: Every day | ORAL | Status: DC

## 2018-12-02 MED ORDER — MENTHOL 3 MG MT LOZG: 1.0000 | LOZENGE | OROMUCOSAL | Status: DC | PRN

## 2018-12-02 MED ORDER — DEXAMETHASONE SODIUM PHOSPHATE 10 MG/ML IJ SOLN
INTRAMUSCULAR | Status: AC
  Filled 2018-12-02: qty 2

## 2018-12-02 MED ORDER — FENTANYL CITRATE (PF) 250 MCG/5ML IJ SOLN
INTRAMUSCULAR | Status: AC
  Filled 2018-12-02: qty 5

## 2018-12-02 MED ORDER — ONDANSETRON HCL 4 MG/2ML IJ SOLN: 4.0000 mg | Freq: Four times a day (QID) | INTRAMUSCULAR | Status: DC | PRN

## 2018-12-02 MED ORDER — MIDAZOLAM HCL 2 MG/2ML IJ SOLN
INTRAMUSCULAR | Status: AC
  Filled 2018-12-02: qty 2

## 2018-12-02 MED ORDER — THROMBIN 20000 UNITS EX SOLR
CUTANEOUS | Status: AC
  Filled 2018-12-02: qty 20000

## 2018-12-02 MED ORDER — EPHEDRINE SULFATE-NACL 50-0.9 MG/10ML-% IV SOSY
PREFILLED_SYRINGE | INTRAVENOUS | Status: DC | PRN
  Administered 2018-12-02: 10 mg via INTRAVENOUS

## 2018-12-02 MED ORDER — PHENOL 1.4 % MT LIQD: 1.0000 | OROMUCOSAL | Status: DC | PRN

## 2018-12-02 MED ORDER — PANTOPRAZOLE SODIUM 40 MG IV SOLR
40.0000 mg | Freq: Every day | INTRAVENOUS | Status: DC
  Filled 2018-12-02: qty 40

## 2018-12-02 MED ORDER — ALUM & MAG HYDROXIDE-SIMETH 200-200-20 MG/5ML PO SUSP: 30.0000 mL | Freq: Four times a day (QID) | ORAL | Status: DC | PRN

## 2018-12-02 MED ORDER — FENTANYL CITRATE (PF) 100 MCG/2ML IJ SOLN
INTRAMUSCULAR | Status: DC | PRN
  Administered 2018-12-02: 100 ug via INTRAVENOUS
  Administered 2018-12-02: 150 ug via INTRAVENOUS

## 2018-12-02 MED ORDER — OXYCODONE HCL 5 MG PO TABS: 5.0000 mg | ORAL_TABLET | Freq: Once | ORAL | Status: DC | PRN

## 2018-12-02 MED ORDER — OXYCODONE HCL 5 MG/5ML PO SOLN: 5.0000 mg | Freq: Once | ORAL | Status: DC | PRN

## 2018-12-02 MED ORDER — PROPOFOL 10 MG/ML IV BOLUS
INTRAVENOUS | Status: AC
  Filled 2018-12-02: qty 20

## 2018-12-02 MED ORDER — CEFAZOLIN SODIUM 1 G IJ SOLR
INTRAMUSCULAR | Status: AC
  Filled 2018-12-02: qty 20

## 2018-12-02 MED ORDER — SODIUM CHLORIDE 0.9% FLUSH: 3.0000 mL | INTRAVENOUS | Status: DC | PRN

## 2018-12-02 MED ORDER — HYDROCODONE-ACETAMINOPHEN 5-325 MG PO TABS: 1.0000 | ORAL_TABLET | ORAL | 0 refills | Status: DC | PRN

## 2018-12-02 MED ORDER — LACTATED RINGERS IV SOLN: INTRAVENOUS | Status: DC

## 2018-12-02 MED ORDER — LIDOCAINE-EPINEPHRINE 1 %-1:100000 IJ SOLN
INTRAMUSCULAR | Status: DC | PRN
  Administered 2018-12-02: 10 mL

## 2018-12-02 MED ORDER — CEFAZOLIN SODIUM-DEXTROSE 2-4 GM/100ML-% IV SOLN
2.0000 g | INTRAVENOUS | Status: AC
  Administered 2018-12-02: 2 g via INTRAVENOUS

## 2018-12-02 MED ORDER — BUPIVACAINE HCL (PF) 0.25 % IJ SOLN
INTRAMUSCULAR | Status: DC | PRN
  Administered 2018-12-02: 17 mL

## 2018-12-02 MED ORDER — CYCLOBENZAPRINE HCL 10 MG PO TABS: 10.0000 mg | ORAL_TABLET | Freq: Three times a day (TID) | ORAL | Status: DC | PRN

## 2018-12-02 MED ORDER — THROMBIN 5000 UNITS EX SOLR
CUTANEOUS | Status: AC
  Filled 2018-12-02: qty 5000

## 2018-12-02 MED ORDER — MIDAZOLAM HCL 5 MG/5ML IJ SOLN
INTRAMUSCULAR | Status: DC | PRN
  Administered 2018-12-02: 2 mg via INTRAVENOUS

## 2018-12-02 MED ORDER — ACETAMINOPHEN 10 MG/ML IV SOLN
INTRAVENOUS | Status: AC
  Filled 2018-12-02: qty 100

## 2018-12-02 MED ORDER — CEFAZOLIN SODIUM-DEXTROSE 2-4 GM/100ML-% IV SOLN
2.0000 g | Freq: Three times a day (TID) | INTRAVENOUS | Status: AC
  Administered 2018-12-02 – 2018-12-03 (×2): 2 g via INTRAVENOUS
  Filled 2018-12-02 (×2): qty 100

## 2018-12-02 MED ORDER — OXYCODONE HCL 5 MG PO TABS
10.0000 mg | ORAL_TABLET | ORAL | Status: DC | PRN
  Administered 2018-12-02 – 2018-12-03 (×3): 10 mg via ORAL
  Administered 2018-12-03: 5 mg via ORAL
  Filled 2018-12-02 (×4): qty 2

## 2018-12-02 MED ORDER — ONDANSETRON HCL 4 MG/2ML IJ SOLN
INTRAMUSCULAR | Status: DC | PRN
  Administered 2018-12-02: 4 mg via INTRAVENOUS

## 2018-12-02 MED ORDER — ROCURONIUM BROMIDE 10 MG/ML (PF) SYRINGE
PREFILLED_SYRINGE | INTRAVENOUS | Status: DC | PRN
  Administered 2018-12-02 (×2): 10 mg via INTRAVENOUS
  Administered 2018-12-02: 60 mg via INTRAVENOUS
  Administered 2018-12-02: 10 mg via INTRAVENOUS

## 2018-12-02 MED ORDER — SODIUM CHLORIDE 0.9 % IV SOLN
250.0000 mL | INTRAVENOUS | Status: DC
  Administered 2018-12-02: 250 mL via INTRAVENOUS

## 2018-12-02 MED ORDER — LACTATED RINGERS IV SOLN
INTRAVENOUS | Status: DC
  Administered 2018-12-02 (×2): via INTRAVENOUS

## 2018-12-02 MED ORDER — DEXAMETHASONE SODIUM PHOSPHATE 10 MG/ML IJ SOLN
INTRAMUSCULAR | Status: AC
  Filled 2018-12-02: qty 1

## 2018-12-02 MED ORDER — HYDROCHLOROTHIAZIDE 25 MG PO TABS
25.0000 mg | ORAL_TABLET | Freq: Every day | ORAL | Status: DC
  Administered 2018-12-02: 25 mg via ORAL
  Filled 2018-12-02 (×2): qty 1

## 2018-12-02 MED ORDER — SUCCINYLCHOLINE CHLORIDE 200 MG/10ML IV SOSY
PREFILLED_SYRINGE | INTRAVENOUS | Status: AC
  Filled 2018-12-02: qty 10

## 2018-12-02 MED ORDER — FENTANYL CITRATE (PF) 100 MCG/2ML IJ SOLN
INTRAMUSCULAR | Status: AC
  Filled 2018-12-02: qty 2

## 2018-12-02 MED ORDER — BUPIVACAINE HCL (PF) 0.25 % IJ SOLN
INTRAMUSCULAR | Status: AC
  Filled 2018-12-02: qty 30

## 2018-12-02 MED ORDER — LIDOCAINE-EPINEPHRINE 1 %-1:100000 IJ SOLN
INTRAMUSCULAR | Status: AC
  Filled 2018-12-02: qty 1

## 2018-12-02 MED ORDER — CEFAZOLIN SODIUM-DEXTROSE 2-4 GM/100ML-% IV SOLN
INTRAVENOUS | Status: AC
  Filled 2018-12-02: qty 100

## 2018-12-02 MED ORDER — FENTANYL CITRATE (PF) 100 MCG/2ML IJ SOLN
25.0000 ug | INTRAMUSCULAR | Status: DC | PRN
  Administered 2018-12-02 (×2): 50 ug via INTRAVENOUS

## 2018-12-02 MED ORDER — ACETAMINOPHEN 10 MG/ML IV SOLN
1000.0000 mg | Freq: Once | INTRAVENOUS | Status: DC | PRN
  Administered 2018-12-02: 1000 mg via INTRAVENOUS

## 2018-12-02 MED ORDER — METHOCARBAMOL 500 MG PO TABS: 500.0000 mg | ORAL_TABLET | Freq: Four times a day (QID) | ORAL | 0 refills | Status: DC

## 2018-12-02 MED ORDER — 0.9 % SODIUM CHLORIDE (POUR BTL) OPTIME
TOPICAL | Status: DC | PRN
  Administered 2018-12-02: 1000 mL

## 2018-12-02 MED ORDER — ONDANSETRON HCL 4 MG PO TABS: 4.0000 mg | ORAL_TABLET | Freq: Four times a day (QID) | ORAL | Status: DC | PRN

## 2018-12-02 MED ORDER — DEXAMETHASONE SODIUM PHOSPHATE 10 MG/ML IJ SOLN
10.0000 mg | INTRAMUSCULAR | Status: AC
  Administered 2018-12-02: 16:00:00 10 mg via INTRAVENOUS

## 2018-12-02 MED ORDER — SUGAMMADEX SODIUM 200 MG/2ML IV SOLN
INTRAVENOUS | Status: DC | PRN
  Administered 2018-12-02: 200 mg via INTRAVENOUS

## 2018-12-02 MED ORDER — ONDANSETRON HCL 4 MG/2ML IJ SOLN
INTRAMUSCULAR | Status: AC
  Filled 2018-12-02: qty 4

## 2018-12-02 MED ORDER — SODIUM CHLORIDE 0.9 % IV SOLN
INTRAVENOUS | Status: DC | PRN
  Administered 2018-12-02: 17:00:00

## 2018-12-02 MED ORDER — LIDOCAINE 2% (20 MG/ML) 5 ML SYRINGE
INTRAMUSCULAR | Status: AC
  Filled 2018-12-02: qty 5

## 2018-12-02 MED ORDER — ACETAMINOPHEN 650 MG RE SUPP: 650.0000 mg | RECTAL | Status: DC | PRN

## 2018-12-02 MED ORDER — LEVOTHYROXINE SODIUM 50 MCG PO TABS
50.0000 ug | ORAL_TABLET | Freq: Every day | ORAL | Status: DC
  Administered 2018-12-03: 50 ug via ORAL
  Filled 2018-12-02: qty 1

## 2018-12-02 MED ORDER — ROCURONIUM BROMIDE 10 MG/ML (PF) SYRINGE
PREFILLED_SYRINGE | INTRAVENOUS | Status: AC
  Filled 2018-12-02: qty 10

## 2018-12-02 MED ORDER — SODIUM CHLORIDE 0.9% FLUSH
3.0000 mL | Freq: Two times a day (BID) | INTRAVENOUS | Status: DC
  Administered 2018-12-02 – 2018-12-03 (×2): 3 mL via INTRAVENOUS

## 2018-12-02 MED ORDER — HYDROMORPHONE HCL 1 MG/ML IJ SOLN: 0.5000 mg | INTRAMUSCULAR | Status: DC | PRN

## 2018-12-02 SURGICAL SUPPLY — 55 items
BAG DECANTER FOR FLEXI CONT (MISCELLANEOUS) ×3 IMPLANT
BENZOIN TINCTURE PRP APPL 2/3 (GAUZE/BANDAGES/DRESSINGS) ×3 IMPLANT
BLADE CLIPPER SURG (BLADE) IMPLANT
BLADE SURG 11 STRL SS (BLADE) ×3 IMPLANT
BUR MATCHSTICK NEURO 3.0 LAGG (BURR) ×3 IMPLANT
BUR PRECISION FLUTE 6.0 (BURR) ×3 IMPLANT
CANISTER SUCT 3000ML PPV (MISCELLANEOUS) ×3 IMPLANT
CARTRIDGE OIL MAESTRO DRILL (MISCELLANEOUS) ×1 IMPLANT
CLOSURE WOUND 1/2 X4 (GAUZE/BANDAGES/DRESSINGS) ×1
COVER WAND RF STERILE (DRAPES) IMPLANT
DECANTER SPIKE VIAL GLASS SM (MISCELLANEOUS) ×3 IMPLANT
DERMABOND ADVANCED (GAUZE/BANDAGES/DRESSINGS) ×2
DERMABOND ADVANCED .7 DNX12 (GAUZE/BANDAGES/DRESSINGS) ×1 IMPLANT
DIFFUSER DRILL AIR PNEUMATIC (MISCELLANEOUS) ×3 IMPLANT
DRAPE HALF SHEET 40X57 (DRAPES) IMPLANT
DRAPE LAPAROTOMY 100X72X124 (DRAPES) ×3 IMPLANT
DRAPE MICROSCOPE LEICA (MISCELLANEOUS) ×3 IMPLANT
DRAPE POUCH INSTRU U-SHP 10X18 (DRAPES) IMPLANT
DRAPE SURG 17X23 STRL (DRAPES) ×3 IMPLANT
DRSG OPSITE POSTOP 3X4 (GAUZE/BANDAGES/DRESSINGS) ×3 IMPLANT
DRSG OPSITE POSTOP 4X6 (GAUZE/BANDAGES/DRESSINGS) ×6 IMPLANT
ELECT REM PT RETURN 9FT ADLT (ELECTROSURGICAL) ×3
ELECTRODE REM PT RTRN 9FT ADLT (ELECTROSURGICAL) ×1 IMPLANT
GAUZE 4X4 16PLY RFD (DISPOSABLE) IMPLANT
GAUZE SPONGE 4X4 12PLY STRL (GAUZE/BANDAGES/DRESSINGS) ×3 IMPLANT
GLOVE BIO SURGEON STRL SZ7 (GLOVE) IMPLANT
GLOVE BIO SURGEON STRL SZ8 (GLOVE) ×3 IMPLANT
GLOVE BIOGEL PI IND STRL 7.0 (GLOVE) IMPLANT
GLOVE BIOGEL PI INDICATOR 7.0 (GLOVE)
GLOVE ECLIPSE 7.5 STRL STRAW (GLOVE) IMPLANT
GLOVE EXAM NITRILE XL STR (GLOVE) IMPLANT
GLOVE INDICATOR 8.5 STRL (GLOVE) ×6 IMPLANT
GOWN STRL REUS W/ TWL LRG LVL3 (GOWN DISPOSABLE) ×1 IMPLANT
GOWN STRL REUS W/ TWL XL LVL3 (GOWN DISPOSABLE) ×2 IMPLANT
GOWN STRL REUS W/TWL 2XL LVL3 (GOWN DISPOSABLE) IMPLANT
GOWN STRL REUS W/TWL LRG LVL3 (GOWN DISPOSABLE) ×2
GOWN STRL REUS W/TWL XL LVL3 (GOWN DISPOSABLE) ×4
KIT BASIN OR (CUSTOM PROCEDURE TRAY) ×3 IMPLANT
KIT TURNOVER KIT B (KITS) ×3 IMPLANT
NEEDLE HYPO 22GX1.5 SAFETY (NEEDLE) ×3 IMPLANT
NEEDLE SPNL 22GX3.5 QUINCKE BK (NEEDLE) ×6 IMPLANT
NS IRRIG 1000ML POUR BTL (IV SOLUTION) ×3 IMPLANT
OIL CARTRIDGE MAESTRO DRILL (MISCELLANEOUS) ×3
PACK LAMINECTOMY NEURO (CUSTOM PROCEDURE TRAY) ×3 IMPLANT
RUBBERBAND STERILE (MISCELLANEOUS) ×6 IMPLANT
SPONGE SURGIFOAM ABS GEL 100 (HEMOSTASIS) ×3 IMPLANT
SPONGE SURGIFOAM ABS GEL SZ50 (HEMOSTASIS) IMPLANT
STRIP CLOSURE SKIN 1/2X4 (GAUZE/BANDAGES/DRESSINGS) ×2 IMPLANT
SUT VIC AB 0 CT1 18XCR BRD8 (SUTURE) ×1 IMPLANT
SUT VIC AB 0 CT1 8-18 (SUTURE) ×2
SUT VIC AB 2-0 CT1 18 (SUTURE) ×3 IMPLANT
SUT VICRYL 4-0 PS2 18IN ABS (SUTURE) ×3 IMPLANT
TOWEL GREEN STERILE (TOWEL DISPOSABLE) ×3 IMPLANT
TOWEL GREEN STERILE FF (TOWEL DISPOSABLE) ×3 IMPLANT
WATER STERILE IRR 1000ML POUR (IV SOLUTION) ×3 IMPLANT

## 2018-12-02 NOTE — Op Note (Signed)
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  Through a separate incision lumbar microdiscectomy L2-3 with microdissection of the right L3 nerve root microscopic discectomy  Surgeon: Dominica Severin Jaquarious Grey  Assistant: Kristeen Miss  Anesthesia: General  EBL: Minimal  HPI: 58 year old female with progressive worsening back and right hip and leg pain rating down through T12 L1-L2 distribution.  Work-up revealed spinal stenosis disc herniations at L2-3 as well as T12-L1.  Due to patient's progressive clinical syndrome imaging findings and failed conservative treatment I recommended laminectomy microdiscectomy at both levels.  I extensively reviewed the risks and benefits of the operation with the patient as well as perioperative course expectations of outcome and alternatives to surgery and she understands and agrees to proceed forward.  Operative procedure: Patient was brought into the OR was induced under general anesthesia was placed prone on the Wilson frame her back was prepped and draped in routine sterile fashion preoperative x-ray localized both levels.  After infiltration of 10 cc lidocaine with epi midline incision was made over both levels 2 separate incisions.  Subperiosteal dissection was carried lamina of T12-L1 as well as L2-3.  Intraoperative x-ray confirmed the correct levels at both levels.  Then laminotomies were begun at both levels with a high-speed drill 2 and 3 Miller Kerrison punch.  Ligament flow was identified and removed in piecemeal fashion.  Then under microscopic illumination first working at T12-L1 the medial facet complex was drilled down to gain access to the lateral aspect the canal and gaining access more laterally than normal because of T12-L1 into the disc space remove several  large free fragments that have migrated centrally causing severe thecal sac compression.  At the end discectomy there is no further stenosis on the thecal sac or exiting right L1 nerve root.  This was packed with Gelfoam tension taken at L2-3.  In a similar fashion L2-3 was drilled down under biting the medial gutter allowed decompression of the central canal as well as the lateral recess disc space was incised several free primacy disc removed in the disc base and the space was cleaned out pituitary rongeurs and Epstein curettes.  At the end of discectomy there is no further stenosis of the thecal sac or exiting L2 and L3 nerve roots.  Then meticulous hemostasis was maintained at both levels both levels were copiously irrigated Gelfoam was overlaid top of the dura and the muscle fascia approximate layers through both incisions with interrupted Vicryl and the skin was closed running 4 subcuticular at both incisions Dermabond benzoin Steri-Strips and a sterile dressing was applied.  At the end the case all needle count sponge counts were correct.

## 2018-12-02 NOTE — Anesthesia Preprocedure Evaluation (Addendum)
Anesthesia Evaluation  Patient identified by MRN, date of birth, ID band Patient awake    Reviewed: Allergy & Precautions, NPO status , Patient's Chart, lab work & pertinent test results  History of Anesthesia Complications Negative for: history of anesthetic complications  Airway Mallampati: II  TM Distance: >3 FB Neck ROM: Full    Dental  (+) Edentulous Upper, Edentulous Lower   Pulmonary Current Smoker,    Pulmonary exam normal        Cardiovascular hypertension, Pt. on medications Normal cardiovascular exam     Neuro/Psych negative neurological ROS     GI/Hepatic Neg liver ROS, GERD  Controlled and Medicated,  Endo/Other  Hypothyroidism   Renal/GU negative Renal ROS     Musculoskeletal negative musculoskeletal ROS (+)   Abdominal   Peds  Hematology negative hematology ROS (+)   Anesthesia Other Findings Day of surgery medications reviewed with the patient.  Reproductive/Obstetrics                            Anesthesia Physical Anesthesia Plan  ASA: II  Anesthesia Plan: General   Post-op Pain Management:    Induction: Intravenous  PONV Risk Score and Plan: 3 and Treatment may vary due to age or medical condition, Ondansetron, Dexamethasone and Midazolam  Airway Management Planned: Oral ETT  Additional Equipment:   Intra-op Plan:   Post-operative Plan: Extubation in OR  Informed Consent: I have reviewed the patients History and Physical, chart, labs and discussed the procedure including the risks, benefits and alternatives for the proposed anesthesia with the patient or authorized representative who has indicated his/her understanding and acceptance.     Dental advisory given  Plan Discussed with: CRNA  Anesthesia Plan Comments:        Anesthesia Quick Evaluation

## 2018-12-02 NOTE — Anesthesia Procedure Notes (Signed)
Procedure Name: Intubation Date/Time: 12/02/2018 4:03 PM Performed by: Moshe Salisbury, CRNA Pre-anesthesia Checklist: Patient identified, Emergency Drugs available, Suction available and Patient being monitored Patient Re-evaluated:Patient Re-evaluated prior to induction Oxygen Delivery Method: Circle System Utilized Preoxygenation: Pre-oxygenation with 100% oxygen Induction Type: IV induction Ventilation: Mask ventilation without difficulty Laryngoscope Size: Mac and 3 Grade View: Grade II Tube type: Oral Tube size: 7.5 mm Number of attempts: 1 Airway Equipment and Method: Stylet Placement Confirmation: ETT inserted through vocal cords under direct vision,  positive ETCO2 and breath sounds checked- equal and bilateral Secured at: 20 cm Tube secured with: Tape Dental Injury: Teeth and Oropharynx as per pre-operative assessment

## 2018-12-02 NOTE — Progress Notes (Signed)
PHARMACIST - PHYSICIAN ORDER COMMUNICATION  CONCERNING: P&T Medication Policy on Herbal Medications  DESCRIPTION:  This patient's order for:  Emergen-C immune pack  has been noted.  This product(s) is classified as an "herbal" or natural product. Due to a lack of definitive safety studies or FDA approval, nonstandard manufacturing practices, plus the potential risk of unknown drug-drug interactions while on inpatient medications, the Pharmacy and Therapeutics Committee does not permit the use of "herbal" or natural products of this type within [facility].   ACTION TAKEN: The pharmacy department is unable to verify this order at this time and your patient has been informed of this safety policy. Please reevaluate patient's clinical condition at discharge and address if the herbal or natural product(s) should be resumed at that time.  Charonda Hefter A. Levada Dy, PharmD, [place] Please utilize Amion for appropriate phone number to reach the unit pharmacist (Welcome)

## 2018-12-02 NOTE — H&P (Signed)
Tammy Mccall is an 58 y.o. female.   Chief Complaint: Back and right leg pain HPI: 58 year old female with longstanding back and right leg pain that she is worked on conservatively for the last 2 to 3 years pain radiates from her back around her right groin into the medial aspect of her right thigh.  Work-up revealed a large central disc herniation at T12-L1 as well as moderate right-sided lateral recess stenosis at L2-3.  Clinical presentation was consistent with both L1-L2 and L3 radiculopathies so due to her progression of clinical syndrome and failed conservative treatment imaging findings I recommended lumbar laminectomy microdiscectomies at T12-L1 and L2-3.  I extensively went over the risks and benefits of the procedure with her as well as perioperative course expectations of outcome and alternatives of surgery and she understands and agrees to proceed forward.  Past Medical History:  Diagnosis Date  . GERD (gastroesophageal reflux disease)    tx maalox qhs  . Hypertension   . Hypothyroidism   . Seasonal allergies   . SVD (spontaneous vaginal delivery)    x 1  . Thyroid disease     Past Surgical History:  Procedure Laterality Date  . CESAREAN SECTION  1990's   x 1 Twins  . CHOLECYSTECTOMY  2004  . COLONOSCOPY  08/2013  . MIDDLE EAR SURGERY     drum rupture  . MULTIPLE TOOTH EXTRACTIONS     all teeth - dentures  . NECK SURGERY    . stomach surgery    . TONSILLECTOMY    . TUBAL LIGATION      Family History  Problem Relation Age of Onset  . Hashimoto's thyroiditis Mother   . Alzheimer's disease Mother   . Gallbladder disease Father    Social History:  reports that she has been smoking cigarettes. She has a 45.00 pack-year smoking history. She has never used smokeless tobacco. She reports current alcohol use of about 3.0 standard drinks of alcohol per week. She reports that she does not use drugs.  Allergies: No Known Allergies  Medications Prior to Admission   Medication Sig Dispense Refill  . aluminum-magnesium hydroxide-simethicone (MAALOX) 161-096-04 MG/5ML SUSP Take 15 mLs by mouth at bedtime.     . hydrochlorothiazide (HYDRODIURIL) 25 MG tablet Take 25 mg by mouth daily.    Marland Kitchen levothyroxine (SYNTHROID, LEVOTHROID) 50 MCG tablet Take 50 mcg by mouth daily before breakfast.   11  . Multiple Vitamins-Minerals (EMERGEN-C IMMUNE PO) Take 3 tablets by mouth daily at 3 pm.      No results found for this or any previous visit (from the past 48 hour(s)). No results found.  Review of Systems  Musculoskeletal: Positive for back pain.  Neurological: Positive for tingling and sensory change.    Blood pressure (!) 154/89, pulse 91, temperature 98.5 F (36.9 C), temperature source Oral, resp. rate 20, SpO2 99 %. Physical Exam  Constitutional: She is oriented to person, place, and time. She appears well-developed and well-nourished.  HENT:  Head: Normocephalic.  Eyes: Pupils are equal, round, and reactive to light.  Neck: Normal range of motion.  Cardiovascular: Normal rate.  Respiratory: Effort normal and breath sounds normal.  GI: Soft. Bowel sounds are normal.  Neurological: She is alert and oriented to person, place, and time. She has normal strength. GCS eye subscore is 4. GCS verbal subscore is 5. GCS motor subscore is 6.  Patient is awake and alert strength is 5-5 iliopsoas, quads, hamstrings, gastrocs, into tibialis, EHL.  Skin:  Skin is warm and dry.     Assessment/Plan 58 year old female presents for T12-L1 and L2-3 laminectomy microdiscectomy  Angelette Ganus P, MD 12/02/2018, 3:25 PM

## 2018-12-02 NOTE — Transfer of Care (Signed)
Immediate Anesthesia Transfer of Care Note  Patient: Tammy Mccall  Procedure(s) Performed: Microdiscectomy - T12-L1 - L2-L3 - right (Right Back)  Patient Location: PACU  Anesthesia Type:General  Level of Consciousness: awake, alert  and oriented  Airway & Oxygen Therapy: Patient Spontanous Breathing  Post-op Assessment: Report given to RN and Post -op Vital signs reviewed and stable  Post vital signs: Reviewed and stable  Last Vitals:  Vitals Value Taken Time  BP 139/85 12/02/2018  6:21 PM  Temp    Pulse 105 12/02/2018  6:24 PM  Resp 16 12/02/2018  6:24 PM  SpO2 97 % 12/02/2018  6:24 PM  Vitals shown include unvalidated device data.  Last Pain:  Vitals:   12/02/18 1202  TempSrc: Oral         Complications: No apparent anesthesia complications

## 2018-12-03 ENCOUNTER — Other Ambulatory Visit: Payer: Self-pay

## 2018-12-03 DIAGNOSIS — M5105 Intervertebral disc disorders with myelopathy, thoracolumbar region: Secondary | ICD-10-CM | POA: Diagnosis not present

## 2018-12-03 MED ORDER — PANTOPRAZOLE SODIUM 40 MG PO TBEC: 40.0000 mg | DELAYED_RELEASE_TABLET | Freq: Every day | ORAL | Status: DC

## 2018-12-03 NOTE — Anesthesia Postprocedure Evaluation (Signed)
Anesthesia Post Note  Patient: Tammy Mccall [place]  Procedure(s) Performed: Microdiscectomy - T12-L1 - L2-L3 - right (Right Back)     Patient location during evaluation: PACU Anesthesia Type: General Level of consciousness: sedated and patient cooperative Pain management: pain level controlled Vital Signs Assessment: post-procedure vital signs reviewed and stable Respiratory status: spontaneous breathing Cardiovascular status: stable Anesthetic complications: no    Last Vitals:  Vitals:   12/03/18 0451 12/03/18 0802  BP:  106/69  Pulse: 75 63  Resp:  16  Temp:  37.1 C  SpO2:  94%    Last Pain:  Vitals:   12/03/18 0802  TempSrc: Oral  PainSc: [place]

## 2018-12-03 NOTE — Progress Notes (Signed)
Gave pt discharge summary. Discharged home via daughter as transportation.

## 2018-12-03 NOTE — Plan of Care (Signed)
  Problem: Education: Goal: Knowledge of General Education information will improve Description Including pain rating scale, medication(s)/side effects and non-pharmacologic comfort measures Outcome: Progressing   Problem: Health Behavior/Discharge Planning: Goal: Ability to manage health-related needs will improve Outcome: Progressing   Problem: Clinical Measurements: Goal: Ability to maintain clinical measurements within normal limits will improve Outcome: Progressing Goal: Will remain free from infection Outcome: Progressing Goal: Diagnostic test results will improve Outcome: Progressing Goal: Respiratory complications will improve Outcome: Progressing Goal: Cardiovascular complication will be avoided Outcome: Progressing   Problem: Nutrition: Goal: Adequate nutrition will be maintained Outcome: Progressing   Problem: Coping: Goal: Level of anxiety will decrease Outcome: Progressing   Problem: Elimination: Goal: Will not experience complications related to bowel motility Outcome: Progressing Goal: Will not experience complications related to urinary retention Outcome: Progressing   Problem: Pain Managment: Goal: General experience of comfort will improve Outcome: Progressing   Problem: Safety: Goal: Ability to remain free from injury will improve Outcome: Progressing   Problem: Skin Integrity: Goal: Risk for impaired skin integrity will decrease Outcome: Progressing   Problem: Education: Goal: Ability to verbalize activity precautions or restrictions will improve Outcome: Progressing Goal: Knowledge of the prescribed therapeutic regimen will improve Outcome: Progressing Goal: Understanding of discharge needs will improve Outcome: Progressing   Problem: Activity: Goal: Ability to avoid complications of mobility impairment will improve Outcome: Progressing Goal: Ability to tolerate increased activity will improve Outcome: Progressing Goal: Will remain  free from falls Outcome: Progressing   Problem: Bowel/Gastric: Goal: Gastrointestinal status for postoperative course will improve Outcome: Progressing   Problem: Clinical Measurements: Goal: Postoperative complications will be avoided or minimized Outcome: Progressing   Problem: Pain Management: Goal: Pain level will decrease Outcome: Progressing   Problem: Skin Integrity: Goal: Will show signs of wound healing Outcome: Progressing   Problem: Bladder/Genitourinary: Goal: Urinary functional status for postoperative course will improve Outcome: Progressing   Ival Bible, BSN, RN

## 2018-12-03 NOTE — Progress Notes (Signed)
Report received from [place], [place] ([facility]ACU) at [phone]. Pt on unit at [facility], AOx[facility], wanting to ambulate. Menu provided to pt and meal ordering process explained.

## 2018-12-03 NOTE — Progress Notes (Signed)
Patient ID: Tammy Mccall, female   DOB: May 22, 1961, 58 y.o.   MRN: [id number] Vital signs are stable Motor function is good Patient is feeling better Wishes to go home

## 2018-12-03 NOTE — Discharge Summary (Signed)
Physician Discharge Summary  Patient ID: Tammy Mccall MRN: [id number] DOB/AGE: 01-29-1961 58 y.o.  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 Active Problems:   HNP (herniated nucleus pulposus), lumbar   Discharged Condition: good  Hospital Course: Patient was admitted to undergo surgical decompression which she tolerated well.  Consults: None  Significant Diagnostic Studies: None  Treatments: surgery: Microdiscectomy T12-L1 L2-L3  Discharge Exam: Blood pressure 106/69, pulse 63, temperature 98.7 F (37.1 C), temperature source Oral, resp. rate 16, height 5\' 7"  (1.702 m), weight 80.2 kg, SpO2 94 %. Incision is clean and dry.  Station and gait are improved from preoperatively.  Better strength in right lower extremity  Disposition: Discharge disposition: 01-Home or Self Care       Discharge Instructions    Call MD for:  redness, tenderness, or signs of infection (pain, swelling, redness, odor or green/yellow discharge around incision site)   Complete by:  As directed    Call MD for:  severe uncontrolled pain   Complete by:  As directed    Call MD for:  temperature >100.4   Complete by:  As directed    Diet - low sodium heart healthy   Complete by:  As directed    Increase activity slowly   Complete by:  As directed      Allergies as of 12/03/2018   No Known Allergies     Medication List    TAKE these medications   aluminum-magnesium hydroxide-simethicone 200-200-20 MG/5ML Susp Commonly known as:  MAALOX Take 15 mLs by mouth at bedtime.   EMERGEN-C IMMUNE PO Take 3 tablets by mouth daily at 3 pm.   hydrochlorothiazide 25 MG tablet Commonly known as:  HYDRODIURIL Take 25 mg by mouth daily.   HYDROcodone-acetaminophen 5-325 MG  tablet Commonly known as:  NORCO/VICODIN Take 1 tablet by mouth every 4 (four) hours as needed for moderate pain.   levothyroxine 50 MCG tablet Commonly known as:  SYNTHROID Take 50 mcg by mouth daily before breakfast.   methocarbamol 500 MG tablet Commonly known as:  Robaxin Take 1 tablet (500 mg total) by mouth 4 (four) times daily.        Signed: Blanchie Dessert Tammy Mccall 12/03/2018, 8:54 AM

## 2018-12-04 ENCOUNTER — Encounter (HOSPITAL_COMMUNITY): Payer: Self-pay | Admitting: Neurosurgery

## 2019-03-24 ENCOUNTER — Ambulatory Visit: Payer: BLUE CROSS/BLUE SHIELD | Admitting: Nurse Practitioner

## 2019-03-24 ENCOUNTER — Ambulatory Visit (INDEPENDENT_AMBULATORY_CARE_PROVIDER_SITE_OTHER): Payer: BC Managed Care – PPO | Admitting: Nurse Practitioner

## 2019-03-24 ENCOUNTER — Other Ambulatory Visit: Payer: Self-pay

## 2019-03-24 ENCOUNTER — Encounter: Payer: Self-pay | Admitting: Nurse Practitioner

## 2019-03-24 VITALS — BP 145/83 | HR 91 | Ht 67.0 in | Wt 177.6 lb

## 2019-03-24 DIAGNOSIS — E039 Hypothyroidism, unspecified: Secondary | ICD-10-CM

## 2019-03-24 DIAGNOSIS — Z7689 Persons encountering health services in other specified circumstances: Secondary | ICD-10-CM

## 2019-03-24 DIAGNOSIS — I10 Essential (primary) hypertension: Secondary | ICD-10-CM

## 2019-03-24 DIAGNOSIS — F17200 Nicotine dependence, unspecified, uncomplicated: Secondary | ICD-10-CM

## 2019-03-24 MED ORDER — LISINOPRIL 10 MG PO TABS: 10.0000 mg | ORAL_TABLET | Freq: Every day | ORAL | 1 refills | Status: DC

## 2019-03-24 NOTE — Patient Instructions (Addendum)
Tammy Mccall,   Thank you for coming in to clinic today.  1. START lisinopril 10 mg once daily.   Some of the possible side effects are:  - angioedema: swelling of lips, mouth, and tongue.  If this rare side effect occurs, please go to ED. - cough: you could develop a dry, hacking cough caused by this medicine.  If it occurs, it will go away after stopping this medicine.  Call the clinic before stopping the medication. - kidney damage: we will monitor your labs when we start this medicine and at least one time per year.  If you do not have an change in kidney function when starting this medicine, it will provide kidney protection over time.  2. CONTINUE hydrochlorothiazide 25 mg once daily  3. Labs next week.  You can eat breakfast.  - REPEAT kidney function 2-3 weeks after starting new lisinopril.  Please schedule a follow-up appointment with [name], AGNP. Return in about 2 months (around 05/24/2019) for hypertension.   If you have any other questions or concerns, please feel free to call the clinic or send a message through [place]. You may also schedule an earlier appointment if necessary.  You will receive a survey after today's visit either digitally by e-mail or paper by [employer]. Your experiences and feedback matter to [place].  Please respond so we know how we are doing as we provide care for you.   [name], DNP, AGNP-BC Adult Gerontology Nurse Practitioner [facility], [facility]   Managing Your Hypertension Hypertension is commonly called high blood pressure. This is when the force of your blood pressing against the walls of your arteries is too strong. Arteries are blood vessels that carry blood from your heart throughout your body. Hypertension forces the heart to work harder to pump blood, and may cause the arteries to become narrow or stiff. Having untreated or uncontrolled hypertension can cause heart attack, stroke, kidney disease, and other  problems. What are blood pressure readings? A blood pressure reading consists of a higher number over a lower number. Ideally, your blood pressure should be below 120/80. The first ("top") number is called the systolic pressure. It is a measure of the pressure in your arteries as your heart beats. The second ("bottom") number is called the diastolic pressure. It is a measure of the pressure in your arteries as the heart relaxes. What does my blood pressure reading mean? Blood pressure is classified into four stages. Based on your blood pressure reading, your health care provider may use the following stages to determine what type of treatment you need, if any. Systolic pressure and diastolic pressure are measured in a unit called mm Hg. Normal  Systolic pressure: below 123456.  Diastolic pressure: below 80. Elevated  Systolic pressure: [id number].  Diastolic pressure: below 80. Hypertension stage 1  Systolic pressure: 0000000.  Diastolic pressure: [id number]. Hypertension stage 2  Systolic pressure: [id number] or above.  Diastolic pressure: 90 or above. What health risks are associated with hypertension? Managing your hypertension is an important responsibility. Uncontrolled hypertension can lead to:  A heart attack.  A stroke.  A weakened blood vessel (aneurysm).  Heart failure.  Kidney damage.  Eye damage.  Metabolic syndrome.  Memory and concentration problems. What changes can I make to manage my hypertension? Hypertension can be managed by making lifestyle changes and possibly by taking medicines. Your health care provider will help you make a plan to bring your blood pressure within a normal  range. Eating and drinking   Eat a diet that is high in fiber and potassium, and low in salt (sodium), added sugar, and fat. An example eating plan is called the DASH (Dietary Approaches to Stop Hypertension) diet. To eat this way: ? Eat plenty of fresh fruits and vegetables. Try to fill half  of your plate at each meal with fruits and vegetables. ? Eat whole grains, such as whole wheat pasta, brown rice, or whole grain bread. Fill about one quarter of your plate with whole grains. ? Eat low-fat diary products. ? Avoid fatty cuts of meat, processed or cured meats, and poultry with skin. Fill about one quarter of your plate with lean proteins such as fish, chicken without skin, beans, eggs, and tofu. ? Avoid premade and processed foods. These tend to be higher in sodium, added sugar, and fat.  Reduce your daily sodium intake. Most people with hypertension should eat less than 1,500 mg of sodium a day.  Limit alcohol intake to no more than 1 drink a day for nonpregnant women and 2 drinks a day for men. One drink equals 12 oz of beer, 5 oz of wine, or 1 oz of hard liquor. Lifestyle  Work with your health care provider to maintain a healthy body weight, or to lose weight. Ask what an ideal weight is for you.  Get at least 30 minutes of exercise that causes your heart to beat faster (aerobic exercise) most days of the week. Activities may include walking, swimming, or biking.  Include exercise to strengthen your muscles (resistance exercise), such as weight lifting, as part of your weekly exercise routine. Try to do these types of exercises for 30 minutes at least 3 days a week.  Do not use any products that contain nicotine or tobacco, such as cigarettes and e-cigarettes. If you need help quitting, ask your health care provider.  Control any long-term (chronic) conditions you have, such as high cholesterol or diabetes. Monitoring  Monitor your blood pressure at home as told by your health care provider. Your personal target blood pressure may vary depending on your medical conditions, your age, and other factors.  Have your blood pressure checked regularly, as often as told by your health care provider. Working with your health care provider  Review all the medicines you take with  your health care provider because there may be side effects or interactions.  Talk with your health care provider about your diet, exercise habits, and other lifestyle factors that may be contributing to hypertension.  Visit your health care provider regularly. Your health care provider can help you create and adjust your plan for managing hypertension. Will I need medicine to control my blood pressure? Your health care provider may prescribe medicine if lifestyle changes are not enough to get your blood pressure under control, and if:  Your systolic blood pressure is 130 or higher.  Your diastolic blood pressure is 80 or higher. Take medicines only as told by your health care provider. Follow the directions carefully. Blood pressure medicines must be taken as prescribed. The medicine does not work as well when you skip doses. Skipping doses also puts you at risk for problems. Contact a health care provider if:  You think you are having a reaction to medicines you have taken.  You have repeated (recurrent) headaches.  You feel dizzy.  You have swelling in your ankles.  You have trouble with your vision. Get help right away if:  You develop a severe  headache or confusion.  You have unusual weakness or numbness, or you feel faint.  You have severe pain in your chest or abdomen.  You vomit repeatedly.  You have trouble breathing. Summary  Hypertension is when the force of blood pumping through your arteries is too strong. If this condition is not controlled, it may put you at risk for serious complications.  Your personal target blood pressure may vary depending on your medical conditions, your age, and other factors. For most people, a normal blood pressure is less than 120/80.  Hypertension is managed by lifestyle changes, medicines, or both. Lifestyle changes include weight loss, eating a healthy, low-sodium diet, exercising more, and limiting alcohol. This information is not  intended to replace advice given to you by your health care provider. Make sure you discuss any questions you have with your health care provider. Document Released: 03/16/2012 Document Revised: 10/14/2018 Document Reviewed: 05/20/2016 Elsevier Patient Education  2020 [employer].

## 2019-03-24 NOTE — Progress Notes (Signed)
Subjective:    Patient ID: Tammy Mccall, female    DOB: 01-11-1961, 58 y.o.   MRN: [id number]  Tammy Mccall is a 58 y.o. female presenting on 03/24/2019 for Establish Care (hypertension)  HPI Establish Care New Provider Pt last seen by PCP 3 months ago.  Obtain records from [facility] if possible.    Hypertension Patient has been on hctz for 3-6 months after being uncontrolled with BP 150s/100 regularly.  Also had back pain at that time.   - She is checking BP at home or outside of clinic.  Readings 145/90s - Current medications: hctz 25 mg once daily, tolerating well without side effects - She is not currently symptomatic. - Pt denies headache, lightheadedness, dizziness, changes in vision, chest tightness/pressure, palpitations, leg swelling, sudden loss of speech or loss of consciousness. - She  reports no regular exercise routine. Sedentary at work. - Her diet is moderate in salt, moderate in fat, and moderate in carbohydrates.   Hypothyroidism - Pt states she is taking her levothyroxine 50 mcg in the am at least 1 hour before eating or drinking and taking other medicines.   - She is not currently symptomatic. - She denies fatigue, excess energy, weight changes, heart racing, heart palpitations, heat and cold intolerance, changes in hair/skin/nails, and lower leg swelling.  - She does not have any compressive symptoms to include difficulty swallowing, globus sensation, or difficulty breathing when lying flat.  - Family History of hashimoto's thyroiditis in mother.  Patient states she has never been tested for antibodies.  Tobacco dependence - 1 ppd cigarette smoker x 50 years. - Not ready to quit smoking.  Past Medical History:  Diagnosis Date  . Colon polyps   . GERD (gastroesophageal reflux disease)    tx maalox qhs  . Hypertension   . Hypothyroidism   . Seasonal allergies   . SVD (spontaneous vaginal delivery)    x 1  . Thyroid disease     Past Surgical History:  Procedure Laterality Date  . CESAREAN SECTION  1990's   x 1 Twins  . CHOLECYSTECTOMY  2004  . COLONOSCOPY  08/2013  . MIDDLE EAR SURGERY     drum rupture  . MULTIPLE TOOTH EXTRACTIONS     all teeth - dentures  . NECK SURGERY    . NECK SURGERY    . stomach surgery    . STOMACH SURGERY    . THORACIC DISCECTOMY Right 12/02/2018   Procedure: Microdiscectomy - T12-L1 - L2-L3 - right;  Surgeon: Kary Kos, MD;  Location: [facility];  Service: Neurosurgery;  Laterality: Right;  . TONSILLECTOMY    . TUBAL LIGATION     Social History   Socioeconomic History  . Marital status: Married    Spouse name: Not on file  . Number of children: 3  . Years of education: Not on file  . Highest education level: Not on file  Occupational History  . Not on file  Social Needs  . Financial resource strain: Not on file  . Food insecurity    Worry: Not on file    Inability: Not on file  . Transportation needs    Medical: Not on file    Non-medical: Not on file  Tobacco Use  . Smoking status: Current Every Day Smoker    Packs/day: 1.00    Years: 50.00    Pack years: 50.00    Types: Cigarettes  . Smokeless tobacco: Never Used  Substance and Sexual  Activity  . Alcohol use: Yes    Alcohol/week: 3.0 standard drinks    Types: 3 Standard drinks or equivalent per week    Comment: champagne  . Drug use: No  . Sexual activity: Not on file  Lifestyle  . Physical activity    Days per week: Not on file    Minutes per session: Not on file  . Stress: Not on file  Relationships  . Social Herbalist on phone: Not on file    Gets together: Not on file    Attends religious service: Not on file    Active member of club or organization: Not on file    Attends meetings of clubs or organizations: Not on file    Relationship status: Not on file  . Intimate partner violence    Fear of current or ex partner: Not on file    Emotionally abused: Not on file    Physically abused:  Not on file    Forced sexual activity: Not on file  Other Topics Concern  . Not on file  Social History Narrative  . Not on file   Family History  Problem Relation Age of Onset  . Hashimoto's thyroiditis Mother   . Alzheimer's disease Mother   . Gallbladder disease Father    Current Outpatient Medications on File Prior to Visit  Medication Sig  . aluminum-magnesium hydroxide-simethicone (MAALOX) [id number] MG/5ML SUSP Take 15 mLs by mouth at bedtime.   . hydrochlorothiazide (HYDRODIURIL) 25 MG tablet Take 25 mg by mouth daily.  Marland Kitchen levothyroxine (SYNTHROID, LEVOTHROID) 50 MCG tablet Take 50 mcg by mouth daily before breakfast.   . methocarbamol (ROBAXIN) 500 MG tablet Take 1 tablet (500 mg total) by mouth 4 (four) times daily.  . Multiple Vitamins-Minerals (EMERGEN-C IMMUNE PO) Take 3 tablets by mouth daily at 3 pm.   No current facility-administered medications on file prior to visit.     Review of Systems  Constitutional: Negative for chills and fever.  HENT: Negative for congestion and sore throat.   Eyes: Negative for pain.  Respiratory: Negative for cough, shortness of breath and wheezing.   Cardiovascular: Negative for chest pain, palpitations and leg swelling.  Gastrointestinal: Negative for abdominal pain, blood in stool, constipation, diarrhea, nausea and vomiting.  Endocrine: Negative for polydipsia.  Genitourinary: Negative for dysuria, frequency, hematuria and urgency.  Musculoskeletal: Negative for back pain, myalgias and neck pain.  Skin: Negative.  Negative for rash.  Allergic/Immunologic: Negative for environmental allergies.  Neurological: Negative for dizziness, weakness and headaches.  Hematological: Does not bruise/bleed easily.  Psychiatric/Behavioral: Negative for dysphoric mood and suicidal ideas. The patient is not nervous/anxious.    Per HPI unless specifically indicated above     Objective:    BP (!) 145/83 (BP Location: Right Arm, Patient  Position: Sitting, Cuff Size: Normal)   Pulse 91   Ht 5\' 7"  (1.702 m)   Wt 177 lb 9.6 oz (80.6 kg)   LMP  (LMP Unknown)   BMI 27.82 kg/m   Wt Readings from Last 3 Encounters:  03/24/19 177 lb 9.6 oz (80.6 kg)  12/02/18 176 lb 12.9 oz (80.2 kg)  11/30/18 176 lb 9 oz (80.1 kg)    Physical Exam Vitals signs reviewed.  Constitutional:      General: She is awake. She is not in acute distress.    Appearance: She is well-developed.  HENT:     Head: Normocephalic and atraumatic.  Neck:     Musculoskeletal:  Normal range of motion and neck supple.     Vascular: No carotid bruit.  Cardiovascular:     Rate and Rhythm: Normal rate and regular rhythm.     Pulses:          Radial pulses are 2+ on the right side and 2+ on the left side.       Posterior tibial pulses are 2+ on the right side and 2+ on the left side.     Heart sounds: Normal heart sounds, S1 normal and S2 normal.  Pulmonary:     Effort: Pulmonary effort is normal. No respiratory distress.     Breath sounds: Normal breath sounds and air entry.  Skin:    General: Skin is warm and dry.  Neurological:     Mental Status: She is alert and oriented to person, place, and time.  Psychiatric:        Attention and Perception: Attention normal.        Mood and Affect: Mood and affect normal.        Behavior: Behavior normal. Behavior is cooperative.    Results for orders placed or performed during the hospital encounter of 11/30/18  SARS Coronavirus 2 (CEPHEID - Performed in [facility] hospital lab), [facility] Order   Specimen: Nasopharyngeal Swab  Result Value Ref Range   SARS Coronavirus 2 NEGATIVE NEGATIVE      Assessment & Plan:   Problem List Items Addressed This Visit    None    Visit Diagnoses    Essential hypertension    -  Primary Uncontrolled hypertension.  BP goal < 130/80.  Pt is not working on lifestyle modifications.  Taking medications tolerating well without side effects. No currently identified complications.   Plan: 1. Continue taking HCTZ 25 mg once daily - START lisinopril 10 mg once daily.  Discussed possible side effects of angioedema (rare), cough (common and reversible), kidney damage (rare, increase monitoring with start). 2. Obtain labs today  3. Encouraged heart healthy diet and increasing exercise to 30 minutes most days of the week. 4. Check BP 1-2 x per week at home, keep log, and bring to clinic at next appointment. 5. Follow up 2 months.     Relevant Medications   lisinopril (ZESTRIL) 10 MG tablet   Other Relevant Orders   Comprehensive metabolic panel   Acquired hypothyroidism     Status unknown.  Recheck labs.  Continue meds without changes today.  Refills provided. Followup 2 months and prn after labs.    Relevant Orders   TSH   Tobacco dependence Patient with 50 pack year smoking history.  Offered cessation assistance.  Patient not ready to quit.  Encouraged regular lung cancer screening.  Patient declines at this time, but may agree to this at a later date.  Encounter to establish care     Previous PCP was at [facility].  Records will not be requested.  Past medical, family, and surgical history reviewed w/ pt.       Meds ordered this encounter  Medications  . lisinopril (ZESTRIL) 10 MG tablet    Sig: Take 1 tablet (10 mg total) by mouth daily.    Dispense:  90 tablet    Refill:  1    Order Specific Question:   Supervising Provider    Answer:   Olin Hauser [[id number]]    Follow up plan: Return in about 2 months (around 05/24/2019) for hypertension.  [name], DNP, AGPCNP-BC Adult Gerontology Primary  Care Nurse Practitioner [facility] 03/24/2019, 1:21 PM

## 2019-03-27 ENCOUNTER — Encounter: Payer: Self-pay | Admitting: Nurse Practitioner

## 2019-03-27 DIAGNOSIS — F17200 Nicotine dependence, unspecified, uncomplicated: Secondary | ICD-10-CM | POA: Insufficient documentation

## 2019-03-27 DIAGNOSIS — I10 Essential (primary) hypertension: Secondary | ICD-10-CM | POA: Insufficient documentation

## 2019-03-27 DIAGNOSIS — E039 Hypothyroidism, unspecified: Secondary | ICD-10-CM | POA: Insufficient documentation

## 2019-03-31 ENCOUNTER — Other Ambulatory Visit: Payer: Self-pay

## 2019-03-31 ENCOUNTER — Other Ambulatory Visit: Payer: BC Managed Care – PPO

## 2019-04-01 LAB — COMPREHENSIVE METABOLIC PANEL
AG Ratio: 2.3 (calc) (ref 1.0–2.5)
ALT: 20 U/L (ref 6–29)
AST: 15 U/L (ref 10–35)
Albumin: 4.2 g/dL (ref 3.6–5.1)
Alkaline phosphatase (APISO): 60 U/L (ref 37–153)
BUN: 16 mg/dL (ref 7–25)
CO2: 26 mmol/L (ref 20–32)
Calcium: 9.1 mg/dL (ref 8.6–10.4)
Chloride: 105 mmol/L (ref 98–110)
Creat: 0.58 mg/dL (ref 0.50–1.05)
Globulin: 1.8 g/dL (calc) — ABNORMAL LOW (ref 1.9–3.7)
Glucose, Bld: 89 mg/dL (ref 65–99)
Potassium: 3.8 mmol/L (ref 3.5–5.3)
Sodium: 142 mmol/L (ref 135–146)
Total Bilirubin: 0.4 mg/dL (ref 0.2–1.2)
Total Protein: 6 g/dL — ABNORMAL LOW (ref 6.1–8.1)

## 2019-04-01 LAB — TSH: TSH: 0.89 mIU/L (ref 0.40–4.50)

## 2019-04-11 ENCOUNTER — Telehealth: Payer: Self-pay | Admitting: Family Medicine

## 2019-04-13 ENCOUNTER — Telehealth: Payer: Self-pay | Admitting: Family Medicine

## 2019-04-13 DIAGNOSIS — E039 Hypothyroidism, unspecified: Secondary | ICD-10-CM

## 2019-04-13 DIAGNOSIS — I10 Essential (primary) hypertension: Secondary | ICD-10-CM

## 2019-04-13 MED ORDER — LEVOTHYROXINE SODIUM 50 MCG PO TABS: 50.0000 ug | ORAL_TABLET | Freq: Every day | ORAL | 1 refills | Status: DC

## 2019-04-13 MED ORDER — HYDROCHLOROTHIAZIDE 25 MG PO TABS: 25.0000 mg | ORAL_TABLET | Freq: Every day | ORAL | 1 refills | Status: DC

## 2019-04-13 NOTE — Telephone Encounter (Signed)
Pt is requesting refill on BP pill (no name  Given),  Synthroid  Called into [facility]. She is out of medication

## 2019-05-19 ENCOUNTER — Ambulatory Visit: Payer: BC Managed Care – PPO | Admitting: Nurse Practitioner

## 2019-08-01 ENCOUNTER — Other Ambulatory Visit: Payer: Self-pay

## 2019-08-01 ENCOUNTER — Encounter: Payer: Self-pay | Admitting: Family Medicine

## 2019-08-01 ENCOUNTER — Ambulatory Visit (INDEPENDENT_AMBULATORY_CARE_PROVIDER_SITE_OTHER): Payer: BC Managed Care – PPO | Admitting: Family Medicine

## 2019-08-01 DIAGNOSIS — K219 Gastro-esophageal reflux disease without esophagitis: Secondary | ICD-10-CM

## 2019-08-01 DIAGNOSIS — R1013 Epigastric pain: Secondary | ICD-10-CM

## 2019-08-01 MED ORDER — OMEPRAZOLE 20 MG PO TBDD: 40.0000 mg | DELAYED_RELEASE_TABLET | Freq: Every day | ORAL | 0 refills | Status: DC

## 2019-08-01 MED ORDER — SUCRALFATE 1 GM/10ML PO SUSP: 1.0000 g | Freq: Three times a day (TID) | ORAL | 0 refills | Status: DC

## 2019-08-01 NOTE — Progress Notes (Signed)
Virtual Visit via Telephone The purpose of this virtual visit is to provide medical care while limiting exposure to the novel coronavirus (COVID19) for both patient and office staff.  Consent was obtained for phone visit:  Yes.   Answered questions that patient had about telehealth interaction:  Yes.   I discussed the limitations, risks, security and privacy concerns of performing an evaluation and management service by telephone. I also discussed with the patient that there may be a patient responsible charge related to this service. The patient expressed understanding and agreed to proceed.  Patient Location: Home Provider Location: [facility])  ---------------------------------------------------------------------- Chief Complaint  Patient presents with  . Abdominal Pain    constant abdominal pain in the upper abdomen. x 1 week    Previous PCP [name], AGPCNP-BC   S: Reviewed CMA documentation. I have called patient and gathered additional HPI as follows:  ABDOMINAL Pain, Epigastric, spasms Reports that symptoms started 1 week ago approximately, seems like a "spasm" in pit in stomach, usually wake her up at night with pain, she takes maalox and it improves, she also takes gas-x PRN for bloating and gas issues. She says feels similar to prior heartburn or GERD problems she has had, she recently saw Dr Audie Pinto again last week and they advised her to take OTC Omeprazole 20mg  daily. She started this, ODT because she cannot swallow the pills well, taken for 3 days so far some relief but still has the "spasms" she mentions. - She has history of prior GERD that was more severe in past, had GERD / Sphincter surgery w/ Nissen Fundoplication in past. Prior EGD in 2015 confirms this did not have esophagitis at that time. - Additional history saw Dr Allen Norris in 2018 for similar issue with abdominal wall pain vs MSK at that time. - She had prior history back surgery in  11/2018, she takes Methocarbamol PRN but not taking regularly. - Denies any nausea vomiting, dark stool blood in stool, worsening abdominal pains, unintentional weight loss  Denies any high risk travel to areas of current concern for COVID19. Denies any known or suspected exposure to person with or possibly with COVID19.  Denies any fevers, chills, sweats, body ache, cough, shortness of breath, sinus pain or pressure, headache, diarrhea  Past Medical History:  Diagnosis Date  . Colon polyps   . GERD (gastroesophageal reflux disease)    tx maalox qhs  . Hypertension   . Hypothyroidism   . Seasonal allergies   . SVD (spontaneous vaginal delivery)    x 1  . Thyroid disease    Social History   Tobacco Use  . Smoking status: Current Every Day Smoker    Packs/day: 1.00    Years: 50.00    Pack years: 50.00    Types: Cigarettes  . Smokeless tobacco: Never Used  Substance Use Topics  . Alcohol use: Yes    Alcohol/week: 3.0 standard drinks    Types: 3 Standard drinks or equivalent per week    Comment: champagne  . Drug use: No    Current Outpatient Medications:  .  aluminum-magnesium hydroxide-simethicone (MAALOX) [id number] MG/5ML SUSP, Take 15 mLs by mouth at bedtime. , Disp: , Rfl:  .  hydrochlorothiazide (HYDRODIURIL) 25 MG tablet, Take 1 tablet (25 mg total) by mouth daily., Disp: 90 tablet, Rfl: 1 .  levothyroxine (SYNTHROID) 50 MCG tablet, Take 1 tablet (50 mcg total) by mouth daily before breakfast., Disp: 90 tablet, Rfl: 1 .  Multiple Vitamins-Minerals (  EMERGEN-C IMMUNE PO), Take 3 tablets by mouth daily at 3 pm., Disp: , Rfl:  .  lisinopril (ZESTRIL) 10 MG tablet, Take 1 tablet (10 mg total) by mouth daily. (Patient not taking: Reported on 08/01/2019), Disp: 90 tablet, Rfl: 1 .  methocarbamol (ROBAXIN) 500 MG tablet, Take 1 tablet (500 mg total) by mouth 4 (four) times daily. (Patient not taking: Reported on 08/01/2019), Disp: 30 tablet, Rfl: 0 .  Omeprazole 20 MG TBDD, Take  40 mg by mouth daily before breakfast., Disp: 30 tablet, Rfl: 0 .  sucralfate (CARAFATE) 1 GM/10ML suspension, Take 10 mLs (1 g total) by mouth 4 (four) times daily -  with meals and at bedtime. As needed only., Disp: 420 mL, Rfl: 0  Depression screen [facility] 2/9 03/24/2019  Decreased Interest 0  Down, Depressed, Hopeless 0  PHQ - 2 Score 0  Altered sleeping 3  Tired, decreased energy 3  Change in appetite 0  Feeling bad or failure about yourself  0  Trouble concentrating 0  Moving slowly or fidgety/restless 0  Suicidal thoughts 0  PHQ-9 Score 6  Difficult doing work/chores Somewhat difficult    No flowsheet data found.  -------------------------------------------------------------------------- O: No physical exam performed due to remote telephone encounter.  Lab results reviewed.  No results found for this or any previous visit (from the past 2160 hour(s)).  -------------------------------------------------------------------------- A&P:  Problem List Items Addressed This Visit    None    Visit Diagnoses    Epigastric pain    -  Primary   Relevant Medications   sucralfate (CARAFATE) 1 GM/10ML suspension   Omeprazole 20 MG TBDD   Gastroesophageal reflux disease, unspecified whether esophagitis present       Relevant Medications   sucralfate (CARAFATE) 1 GM/10ML suspension   Omeprazole 20 MG TBDD     Suspect GERD vs PUD based on clinical history reported and chart review. No clear trigger, but has significant history for GERD w/ prior Nissen Fundoplication surgery, prior EGD 2015 negative and GI consultation in 2018 Dr Allen Norris. However was thought to be more MSK at that time.   No other significant GI red flags (no unintentional wt loss, night-sweats, refractory abdominal pain n/v, hematemesis or melena).  Plan: Urgent care already started her on PPI 3 days ago, therefore would defer H Pylori breath test today Asked her to INCREASE current PPI Omeprazole 20mg  to x 2 = 40mg   daily now - (she requires the ODT due to issue with swallowing the pill), she will take OTC version 20mg  x 2 = 40mg  daily before breakfast now for 2-4 weeks to start, then if beneficial can continue 8 to 12 weeks for potential PUD  Hold Maalox in PM - Start carafate 1g TID WC + QHS PRN for symptom relief Use other OTC gas-x if need PRN May use Robaxin PRN if abdominal spasms Caution NSAID Strict return criteria given for worsening symptoms  Future consider dicyclomine/bentyl PRN spasms as she describes some  Follow-up within 1-2 weeks, if no improvement, will refer to GI for further evaluation, may need future EGD given chronicity of problem. If gradual improvement can f/u in office within 4 weeks  Meds ordered this encounter  Medications  . sucralfate (CARAFATE) 1 GM/10ML suspension    Sig: Take 10 mLs (1 g total) by mouth 4 (four) times daily -  with meals and at bedtime. As needed only.    Dispense:  420 mL    Refill:  0  . Omeprazole 20  MG TBDD    Sig: Take 40 mg by mouth daily before breakfast.    Dispense:  30 tablet    Refill:  0    Follow-up: - Return in 2-4 weeks as needed GERD Abdominal Pain  Patient verbalizes understanding with the above medical recommendations including the limitation of remote medical advice.  Specific follow-up and call-back criteria were given for patient to follow-up or seek medical care more urgently if needed.   - Time spent in direct consultation with patient on phone: 12 minutes  Nobie Putnam, [facility] 08/01/2019, 2:33 PM

## 2019-08-03 ENCOUNTER — Telehealth: Payer: Self-pay

## 2019-08-03 NOTE — Telephone Encounter (Signed)
Sucralfate 100 mg/mL approved from 08/02/19-08/01/2020 # [id number].

## 2019-08-11 NOTE — Telephone Encounter (Signed)
ERROR

## 2019-08-20 ENCOUNTER — Other Ambulatory Visit: Payer: Self-pay | Admitting: Family Medicine

## 2019-08-20 DIAGNOSIS — R1013 Epigastric pain: Secondary | ICD-10-CM

## 2019-08-20 DIAGNOSIS — K219 Gastro-esophageal reflux disease without esophagitis: Secondary | ICD-10-CM

## 2019-08-29 ENCOUNTER — Other Ambulatory Visit: Payer: Self-pay | Admitting: Family Medicine

## 2019-08-29 DIAGNOSIS — R1013 Epigastric pain: Secondary | ICD-10-CM

## 2019-08-29 DIAGNOSIS — K219 Gastro-esophageal reflux disease without esophagitis: Secondary | ICD-10-CM

## 2019-09-05 ENCOUNTER — Telehealth: Payer: Self-pay | Admitting: Nurse Practitioner

## 2019-09-05 DIAGNOSIS — K219 Gastro-esophageal reflux disease without esophagitis: Secondary | ICD-10-CM

## 2019-09-05 DIAGNOSIS — Z9889 Other specified postprocedural states: Secondary | ICD-10-CM

## 2019-09-05 DIAGNOSIS — R1013 Epigastric pain: Secondary | ICD-10-CM

## 2019-09-05 NOTE — Telephone Encounter (Signed)
Pt.said that she was still having stomach issues wanted to know if you would do a referral to a GI Pt.also said that she tried everything you told her to do.

## 2019-09-05 NOTE — Telephone Encounter (Signed)
Reason for Referral: refractory epigastric pain / GERD symptoms, known history prior Nissen Fundoplication surgery, last EGD 2015 and consultation [facility] Dr Allen Norris in 2018. Now on high dose PPI and limited improvement.  Referral discussed with patient: yes  Best contact number of patient for referral team: [phone] (M)  Has patient been seen by a specialist for this issue before: yes  If so, who (practice/provider): Dr Allen Norris / [facility] [place]  Does the patient wish to return: no - any available provider  Patient provider preference for referral: Any available provider  Patient location preference for referral: [place] or [place], [facility] 09/05/2019, 10:26 AM

## 2019-09-25 ENCOUNTER — Ambulatory Visit: Payer: Self-pay | Admitting: Gastroenterology

## 2019-09-25 NOTE — Progress Notes (Deleted)
Gastroenterology Consultation  Referring Provider:     Nobie Putnam * Primary Care Physician:  Mikey College, NP (Inactive) Primary Gastroenterologist:  Dr. Allen Norris     Reason for Consultation:     Epigastric pain        HPI:   Tammy Mccall is a 59 y.o. y/o female referred for consultation & management of epigastric pain by Dr. Merrilyn Puma, Jerrel Ivory, NP (Inactive).  This patient was sent to me after being seen for left-sided and suprapubic pain by me back in 2018.  At that time the patient was found to have musculoskeletal pain but had reported that she thought her pain was attributable to having a clip placed after a polyp was removed years prior.  The patient had an EGD and colonoscopy at an outside facility in 2015 by Dr. Christa See.  At that time the patient was found to have findings consistent with Nissen's fundoplication.  It is now reported that the patient is not having much relief with her high-dose PPI.  Past Medical History:  Diagnosis Date  . Colon polyps   . GERD (gastroesophageal reflux disease)    tx maalox qhs  . Hypertension   . Hypothyroidism   . Seasonal allergies   . SVD (spontaneous vaginal delivery)    x 1  . Thyroid disease     Past Surgical History:  Procedure Laterality Date  . CESAREAN SECTION  1990's   x 1 Twins  . CHOLECYSTECTOMY  2004  . COLONOSCOPY  08/2013  . MIDDLE EAR SURGERY     drum rupture  . MULTIPLE TOOTH EXTRACTIONS     all teeth - dentures  . NECK SURGERY    . NECK SURGERY    . stomach surgery    . STOMACH SURGERY    . THORACIC DISCECTOMY Right 12/02/2018   Procedure: Microdiscectomy - T12-L1 - L2-L3 - right;  Surgeon: Kary Kos, MD;  Location: [place];  Service: Neurosurgery;  Laterality: Right;  . TONSILLECTOMY    . TUBAL LIGATION      Prior to Admission medications   Medication Sig Start Date End Date Taking? Authorizing Provider  aluminum-magnesium hydroxide-simethicone (MAALOX) [id number] MG/5ML SUSP  Take 15 mLs by mouth at bedtime.     [provider]  hydrochlorothiazide (HYDRODIURIL) 25 MG tablet Take 1 tablet (25 mg total) by mouth daily. 04/13/19   Mikey College, NP  levothyroxine (SYNTHROID) 50 MCG tablet Take 1 tablet (50 mcg total) by mouth daily before breakfast. 04/13/19   Mikey College, NP  lisinopril (ZESTRIL) 10 MG tablet Take 1 tablet (10 mg total) by mouth daily. Patient not taking: Reported on 08/01/2019 03/24/19   Mikey College, NP  methocarbamol (ROBAXIN) 500 MG tablet Take 1 tablet (500 mg total) by mouth 4 (four) times daily. Patient not taking: Reported on 08/01/2019 12/02/18   Meyran, Ocie Cornfield, NP  Multiple Vitamins-Minerals (EMERGEN-C IMMUNE PO) Take 3 tablets by mouth daily at 3 pm.    [provider]  Omeprazole 20 MG TBDD Take 40 mg by mouth daily before breakfast. 08/01/19   Parks Ranger, Devonne Doughty, DO  sucralfate (CARAFATE) 1 GM/10ML suspension TAKE 10 MLS BY MOUTH FOUR TIMES DAILY(WITH MEALS AND AT BEDTIME) AS NEEDED ONLY 08/22/19   Olin Hauser, DO    Family History  Problem Relation Age of Onset  . Hashimoto's thyroiditis Mother   . Alzheimer's disease Mother   . Gallbladder disease Father   . Arthritis Sister   .  Heart disease Brother   . Diabetes Brother   . Arthritis Brother   . Arthritis Brother   . Drug abuse Daughter      Social History   Tobacco Use  . Smoking status: Current Every Day Smoker    Packs/day: 1.00    Years: 50.00    Pack years: 50.00    Types: Cigarettes  . Smokeless tobacco: Never Used  Substance Use Topics  . Alcohol use: Yes    Alcohol/week: 3.0 standard drinks    Types: 3 Standard drinks or equivalent per week    Comment: champagne  . Drug use: No    Allergies as of 09/25/2019  . (No Known Allergies)    Review of Systems:    All systems reviewed and negative except where noted in HPI.   Physical Exam:  LMP  (LMP Unknown)  No LMP recorded (lmp unknown).  Patient is postmenopausal. General:   Alert,  Well-developed, well-nourished, pleasant and cooperative in NAD Head:  Normocephalic and atraumatic. Eyes:  Sclera clear, no icterus.   Conjunctiva pink. Ears:  Normal auditory acuity. Neck:  Supple; no masses or thyromegaly. Lungs:  Respirations even and unlabored.  Clear throughout to auscultation.   No wheezes, crackles, or rhonchi. No acute distress. Heart:  Regular rate and rhythm; no murmurs, clicks, rubs, or gallops. Abdomen:  Normal bowel sounds.  No bruits.  Soft, non-tender and non-distended without masses, hepatosplenomegaly or hernias noted.  No guarding or rebound tenderness.  Negative Carnett sign.   Rectal:  Deferred.  Pulses:  Normal pulses noted. Extremities:  No clubbing or edema.  No cyanosis. Neurologic:  Alert and oriented x3;  grossly normal neurologically. Skin:  Intact without significant lesions or rashes.  No jaundice. Lymph Nodes:  No significant cervical adenopathy. Psych:  Alert and cooperative. Normal mood and affect.  Imaging Studies: No results found.  Assessment and Plan:   Tammy Mccall is a 59 y.o. y/o female ***    Tammy Lame, MD. [facility]    Note: This dictation was prepared with Dragon dictation along with smaller phrase technology. Any transcriptional errors that result from this process are unintentional.

## 2019-09-27 ENCOUNTER — Ambulatory Visit (INDEPENDENT_AMBULATORY_CARE_PROVIDER_SITE_OTHER): Payer: BC Managed Care – PPO | Admitting: Gastroenterology

## 2019-09-27 ENCOUNTER — Encounter (INDEPENDENT_AMBULATORY_CARE_PROVIDER_SITE_OTHER): Payer: Self-pay

## 2019-09-27 ENCOUNTER — Other Ambulatory Visit: Payer: Self-pay

## 2019-09-27 ENCOUNTER — Encounter: Payer: Self-pay | Admitting: Gastroenterology

## 2019-09-27 VITALS — BP 147/86 | HR 90 | Temp 96.4°F | Ht 67.0 in | Wt 185.4 lb

## 2019-09-27 DIAGNOSIS — K219 Gastro-esophageal reflux disease without esophagitis: Secondary | ICD-10-CM

## 2019-09-27 DIAGNOSIS — R1013 Epigastric pain: Secondary | ICD-10-CM | POA: Diagnosis not present

## 2019-09-27 NOTE — Progress Notes (Signed)
Gastroenterology Consultation  Referring Provider:     Nobie Putnam * Primary Care Physician:  Tommy Medal, MD Primary Gastroenterologist:  Dr. Allen Norris     Reason for Consultation:     Epigastric pain        HPI:   Tammy Mccall is a 59 y.o. y/o female referred for consultation & management of epigastric pain by Dr. Audie Pinto, Shari Heritage, MD.  This patient comes in today with a report of abdominal pain.  The patient states that her abdominal pain feels better when she drinks champagne.  She states she was drinking 3 small bottles of champagne a day but stopped when her primary care provider told her to stop.  She also takes Maalox and over-the-counter omeprazole in addition to Gas-X.  She states that all of these have not resolved the symptoms.  She also has a history of a Nissen fundoplication many years ago and a adenomatous colon polyp in 2015 when she had an EGD and colonoscopy at an outside facility.  Patient was recommended at that time to have a repeat colonoscopy in 3 years but did not have it done.  There is no report of any unexplained weight loss and in fact the patient states she is gaining weight.  There is no report of any change in bowel habits black stools or bloody stools. She does report that the abdominal pain is made worse with coffee and laying down.  The patient also states that she was started on Carafate but did not take it because it.  Past Medical History:  Diagnosis Date  . Colon polyps   . GERD (gastroesophageal reflux disease)    tx maalox qhs  . Hypertension   . Hypothyroidism   . Seasonal allergies   . SVD (spontaneous vaginal delivery)    x 1  . Thyroid disease     Past Surgical History:  Procedure Laterality Date  . CESAREAN SECTION  1990's   x 1 Twins  . CHOLECYSTECTOMY  2004  . COLONOSCOPY  08/2013  . MIDDLE EAR SURGERY     drum rupture  . MULTIPLE TOOTH EXTRACTIONS     all teeth - dentures  . NECK SURGERY    . NECK  SURGERY    . stomach surgery    . STOMACH SURGERY    . THORACIC DISCECTOMY Right 12/02/2018   Procedure: Microdiscectomy - T12-L1 - L2-L3 - right;  Surgeon: Kary Kos, MD;  Location: [place];  Service: Neurosurgery;  Laterality: Right;  . TONSILLECTOMY    . TUBAL LIGATION      Prior to Admission medications   Medication Sig Start Date End Date Taking? Authorizing Provider  aluminum-magnesium hydroxide-simethicone (MAALOX) [id number] MG/5ML SUSP Take 15 mLs by mouth at bedtime.     [provider]  hydrochlorothiazide (HYDRODIURIL) 25 MG tablet Take 1 tablet (25 mg total) by mouth daily. 04/13/19   Mikey College, NP  levothyroxine (SYNTHROID) 50 MCG tablet Take 1 tablet (50 mcg total) by mouth daily before breakfast. 04/13/19   Mikey College, NP  lisinopril (ZESTRIL) 10 MG tablet Take 1 tablet (10 mg total) by mouth daily. Patient not taking: Reported on 08/01/2019 03/24/19   Mikey College, NP  methocarbamol (ROBAXIN) 500 MG tablet Take 1 tablet (500 mg total) by mouth 4 (four) times daily. Patient not taking: Reported on 08/01/2019 12/02/18   Meyran, Ocie Cornfield, NP  Multiple Vitamins-Minerals (EMERGEN-C IMMUNE PO) Take 3 tablets by mouth daily at  3 pm.    [provider]  Omeprazole 20 MG TBDD Take 40 mg by mouth daily before breakfast. 08/01/19   Parks Ranger, Devonne Doughty, DO  sucralfate (CARAFATE) 1 GM/10ML suspension TAKE 10 MLS BY MOUTH FOUR TIMES DAILY(WITH MEALS AND AT BEDTIME) AS NEEDED ONLY 08/22/19   Parks Ranger, Devonne Doughty, DO    Family History  Problem Relation Age of Onset  . Hashimoto's thyroiditis Mother   . Alzheimer's disease Mother   . Gallbladder disease Father   . Arthritis Sister   . Heart disease Brother   . Diabetes Brother   . Arthritis Brother   . Arthritis Brother   . Drug abuse Daughter      Social History   Tobacco Use  . Smoking status: Current Every Day Smoker    Packs/day: 1.00    Years: 50.00    Pack years:  50.00    Types: Cigarettes  . Smokeless tobacco: Never Used  Substance Use Topics  . Alcohol use: Yes    Alcohol/week: 3.0 standard drinks    Types: 3 Standard drinks or equivalent per week    Comment: champagne  . Drug use: No    Allergies as of 09/27/2019  . (No Known Allergies)    Review of Systems:    All systems reviewed and negative except where noted in HPI.   Physical Exam:  LMP  (LMP Unknown)  No LMP recorded (lmp unknown). Patient is postmenopausal. General:   Alert,  Well-developed, well-nourished, pleasant and cooperative in NAD Head:  Normocephalic and atraumatic. Eyes:  Sclera clear, no icterus.   Conjunctiva pink. Ears:  Normal auditory acuity. Neck:  Supple; no masses or thyromegaly. Lungs:  Respirations even and unlabored.  Clear throughout to auscultation.   No wheezes, crackles, or rhonchi. No acute distress. Heart:  Regular rate and rhythm; no murmurs, clicks, rubs, or gallops. Abdomen:  Normal bowel sounds.  No bruits.  Soft, non-tender and non-distended without masses, hepatosplenomegaly or hernias noted.  No guarding or rebound tenderness.  Negative Carnett sign.   Rectal:  Deferred.  Pulses:  Normal pulses noted. Extremities:  No clubbing or edema.  No cyanosis. Neurologic:  Alert and oriented x3;  grossly normal neurologically. Skin:  Intact without significant lesions or rashes.  No jaundice. Lymph Nodes:  No significant cervical adenopathy. Psych:  Alert and cooperative. Normal mood and affect.  Imaging Studies: No results found.  Assessment and Plan:   Tammy Mccall is a 59 y.o. y/o female who comes in today with epigastric pain and a history of a Nissen fundoplication.  The patient has not been helped with a PPI and Gas-X and Maalox.  The patient has also had a colonoscopy 6 years ago with adenomatous polyps and at that time the gastroenterologist reported that he wanted a repeat colonoscopy 3 years.  The patient will be set up for an  EGD and colonoscopy and has been given samples of Dexilant.  The patient will stop the omeprazole and take the Dexilant to see if she feels better. The patient has been explained the plan and agrees with it.    Lucilla Lame, MD. [facility]    Note: This dictation was prepared with Dragon dictation along with smaller phrase technology. Any transcriptional errors that result from this process are unintentional.

## 2019-09-28 ENCOUNTER — Other Ambulatory Visit: Payer: Self-pay

## 2019-09-28 DIAGNOSIS — K219 Gastro-esophageal reflux disease without esophagitis: Secondary | ICD-10-CM

## 2019-09-28 DIAGNOSIS — R1013 Epigastric pain: Secondary | ICD-10-CM

## 2019-10-06 ENCOUNTER — Other Ambulatory Visit: Payer: Self-pay | Admitting: Nurse Practitioner

## 2019-10-06 DIAGNOSIS — I10 Essential (primary) hypertension: Secondary | ICD-10-CM

## 2019-10-06 DIAGNOSIS — E039 Hypothyroidism, unspecified: Secondary | ICD-10-CM

## 2019-11-01 ENCOUNTER — Other Ambulatory Visit: Admission: RE | Admit: 2019-11-01 | Payer: BC Managed Care – PPO | Source: Ambulatory Visit

## 2019-11-03 ENCOUNTER — Ambulatory Visit
Admission: RE | Admit: 2019-11-03 | Payer: BC Managed Care – PPO | Source: Home / Self Care | Admitting: Gastroenterology

## 2019-11-03 ENCOUNTER — Encounter: Admission: RE | Payer: Self-pay | Source: Home / Self Care

## 2019-11-03 SURGERY — COLONOSCOPY WITH PROPOFOL
Anesthesia: General

## 2020-01-05 DIAGNOSIS — Z6828 Body mass index (BMI) 28.0-28.9, adult: Secondary | ICD-10-CM | POA: Insufficient documentation

## 2020-01-05 DIAGNOSIS — G629 Polyneuropathy, unspecified: Secondary | ICD-10-CM | POA: Insufficient documentation

## 2020-01-05 DIAGNOSIS — M5440 Lumbago with sciatica, unspecified side: Secondary | ICD-10-CM | POA: Insufficient documentation

## 2020-01-07 ENCOUNTER — Other Ambulatory Visit: Payer: Self-pay | Admitting: Family Medicine

## 2020-01-07 DIAGNOSIS — I10 Essential (primary) hypertension: Secondary | ICD-10-CM

## 2020-01-07 DIAGNOSIS — E039 Hypothyroidism, unspecified: Secondary | ICD-10-CM

## 2020-01-07 NOTE — Telephone Encounter (Signed)
Requested Prescriptions  Pending Prescriptions Disp Refills  . levothyroxine (SYNTHROID) 50 MCG tablet [Pharmacy Med Name: LEVOTHYROXINE 0.05MG  (50MCG) TAB] 90 tablet 0    Sig: TAKE 1 TABLET(50 MCG) BY MOUTH DAILY BEFORE BREAKFAST     Endocrinology:  Hypothyroid Agents Failed - 01/07/2020  3:25 AM      Failed - TSH needs to be rechecked within 3 months after an abnormal result. Refill until TSH is due.      Passed - TSH in normal range and within 360 days    TSH  Date Value Ref Range Status  03/31/2019 0.89 0.40 - 4.50 mIU/L Final         Passed - Valid encounter within last 12 months    Recent Outpatient Visits          5 months ago Epigastric pain   [facility] Olin Hauser, DO   9 months ago Essential hypertension   [facility] Merrilyn Puma, Jerrel Ivory, NP             . hydrochlorothiazide (HYDRODIURIL) 25 MG tablet [Pharmacy Med Name: HYDROCHLOROTHIAZIDE 25MG  TABLETS] 90 tablet 0    Sig: TAKE 1 TABLET(25 MG) BY MOUTH DAILY     Cardiovascular: Diuretics - Thiazide Failed - 01/07/2020  3:25 AM      Failed - Last BP in normal range    BP Readings from Last 1 Encounters:  09/27/19 (!) 147/86         Passed - Ca in normal range and within 360 days    Calcium  Date Value Ref Range Status  03/31/2019 9.1 8.6 - 10.4 mg/dL Final         Passed - Cr in normal range and within 360 days    Creat  Date Value Ref Range Status  03/31/2019 0.58 0.50 - 1.05 mg/dL Final    Comment:    For patients >61 years of age, the reference limit for Creatinine is approximately 13% higher for people identified as African-American. .          Passed - K in normal range and within 360 days    Potassium  Date Value Ref Range Status  03/31/2019 3.8 3.5 - 5.3 mmol/L Final         Passed - Na in normal range and within 360 days    Sodium  Date Value Ref Range Status  03/31/2019 142 135 - 146 mmol/L Final         Passed - Valid encounter within  last 6 months    Recent Outpatient Visits          5 months ago Epigastric pain   [place], DO   9 months ago Essential hypertension   [facility] Merrilyn Puma, Jerrel Ivory, NP

## 2020-01-17 ENCOUNTER — Other Ambulatory Visit: Payer: Self-pay | Admitting: Neurosurgery

## 2020-01-17 DIAGNOSIS — M5440 Lumbago with sciatica, unspecified side: Secondary | ICD-10-CM

## 2020-01-29 ENCOUNTER — Other Ambulatory Visit: Payer: Self-pay

## 2020-01-29 ENCOUNTER — Ambulatory Visit
Admission: RE | Admit: 2020-01-29 | Discharge: 2020-01-29 | Disposition: A | Discharge status: Home / Self Care | Payer: BC Managed Care – PPO | Source: Ambulatory Visit | Attending: Neurosurgery | Admitting: Neurosurgery

## 2020-01-29 DIAGNOSIS — M5440 Lumbago with sciatica, unspecified side: Secondary | ICD-10-CM | POA: Insufficient documentation

## 2020-04-05 ENCOUNTER — Other Ambulatory Visit: Payer: Self-pay | Admitting: Family Medicine

## 2020-04-05 DIAGNOSIS — E039 Hypothyroidism, unspecified: Secondary | ICD-10-CM

## 2020-04-05 DIAGNOSIS — I10 Essential (primary) hypertension: Secondary | ICD-10-CM

## 2020-04-05 NOTE — Telephone Encounter (Signed)
Requested  medications are  due for refill today yes  Requested medications are on the active medication list yes  Last refill 7/4   Future visit scheduled no  Notes to clinic Do not see these meds/dx addressed within the protocol guidelines, lab work also out of date, no upcoming visit scheduled.

## 2020-04-07 ENCOUNTER — Other Ambulatory Visit: Payer: Self-pay | Admitting: Family Medicine

## 2020-04-07 DIAGNOSIS — E039 Hypothyroidism, unspecified: Secondary | ICD-10-CM

## 2020-04-07 DIAGNOSIS — I10 Essential (primary) hypertension: Secondary | ICD-10-CM

## 2020-04-08 ENCOUNTER — Other Ambulatory Visit: Payer: Self-pay | Admitting: Family Medicine

## 2020-04-08 DIAGNOSIS — I10 Essential (primary) hypertension: Secondary | ICD-10-CM

## 2020-04-08 DIAGNOSIS — E039 Hypothyroidism, unspecified: Secondary | ICD-10-CM

## 2020-04-08 NOTE — Telephone Encounter (Signed)
Medication Refill - Medication: HCTZ and Levothyroxine  Has the patient contacted their pharmacy? Yes.   (Agent: If no, request that the patient contact the pharmacy for the refill.) (Agent: If yes, when and what did the pharmacy advise?)  Preferred Pharmacy (with phone number or street name): [facility] [place], [place]: Please be advised that RX refills may take up to 3 business days. We ask that you follow-up with your pharmacy.

## 2020-04-09 ENCOUNTER — Other Ambulatory Visit: Payer: Self-pay

## 2020-04-09 DIAGNOSIS — K219 Gastro-esophageal reflux disease without esophagitis: Secondary | ICD-10-CM

## 2020-04-09 DIAGNOSIS — R1013 Epigastric pain: Secondary | ICD-10-CM

## 2020-04-09 DIAGNOSIS — Z86010 Personal history of colonic polyps: Secondary | ICD-10-CM

## 2020-04-11 ENCOUNTER — Other Ambulatory Visit: Payer: Self-pay

## 2020-04-11 ENCOUNTER — Ambulatory Visit (INDEPENDENT_AMBULATORY_CARE_PROVIDER_SITE_OTHER): Payer: BC Managed Care – PPO | Admitting: Family Medicine

## 2020-04-11 ENCOUNTER — Encounter: Payer: Self-pay | Admitting: Family Medicine

## 2020-04-11 VITALS — BP 129/81 | HR 79 | Temp 98.7°F | Resp 18 | Ht 67.0 in | Wt 189.0 lb

## 2020-04-11 DIAGNOSIS — I10 Essential (primary) hypertension: Secondary | ICD-10-CM

## 2020-04-11 DIAGNOSIS — Z79899 Other long term (current) drug therapy: Secondary | ICD-10-CM

## 2020-04-11 DIAGNOSIS — E039 Hypothyroidism, unspecified: Secondary | ICD-10-CM

## 2020-04-11 MED ORDER — HYDROCHLOROTHIAZIDE 25 MG PO TABS: ORAL_TABLET | ORAL | 0 refills | Status: DC

## 2020-04-11 MED ORDER — LISINOPRIL 10 MG PO TABS: 10.0000 mg | ORAL_TABLET | Freq: Every day | ORAL | 0 refills | Status: DC

## 2020-04-11 MED ORDER — LEVOTHYROXINE SODIUM 50 MCG PO TABS: ORAL_TABLET | ORAL | 0 refills | Status: DC

## 2020-04-11 NOTE — Assessment & Plan Note (Signed)
Status unknown, last labs 03/31/2019 and TSH WNL.  Recheck labs.  Continue meds without changes today.  Refills provided. Followup after labs.

## 2020-04-11 NOTE — Patient Instructions (Signed)
As we discussed, have your labs drawn in the next 1-2 weeks and we will contact you with the results.  Once we have your labs resulted, I can go ahead and send in refills on your prescriptions for 90 days.  Try to get exercise a minimum of 30 minutes per day at least 5 days per week as well as  adequate water intake all while measuring blood pressure a few times per week.  Keep a blood pressure log and bring back to clinic at your next visit.  If your readings are consistently over 130/80 to contact our office/send me a MyChart message and we will see you sooner.  Can try DASH and Mediterranean diet options, avoiding processed foods, lowering sodium intake, avoiding pork products, and eating a plant based diet for optimal health.  We will plan to see you back in 3 months for your physical  You will receive a survey after today's visit either digitally by e-mail or paper by [place] mail. Your experiences and feedback matter to [place].  Please respond so we know how we are doing as we provide care for you.  Call us with any questions/concerns/needs.  It is my goal to be available to you for your health concerns.  Thanks for choosing me to be a partner in your healthcare needs!  Harlin Rain, FNP-C Family Nurse Practitioner [facility] Phone: [phone]

## 2020-04-11 NOTE — Assessment & Plan Note (Signed)
Controlled hypertension.  BP is at goal < 130/80.  Pt is working on lifestyle modifications.  Taking medications tolerating well without side effects.  Complications:  Overweight, hypothyroidism, GERD, tobacco use  Plan: 1. Continue taking hydrochlorothiazide 25mg  daily and lisinopril 10mg  daily 2. Obtain labs in the next 1-2 weeks  3. Encouraged heart healthy diet and increasing exercise to 30 minutes most days of the week, going no more than 2 days in a row without exercise. 4. Check BP 1-2 x per week at home, keep log, and bring to clinic at next appointment. 5. Follow up 6 months.

## 2020-04-11 NOTE — Progress Notes (Signed)
Subjective:    Patient ID: Tammy Mccall, female    DOB: [birth year], 59 y.o.   MRN: [id number]  Tammy Mccall is a 59 y.o. female presenting on 04/11/2020 for Hypothyroidism (need refill on medication) and Hypertension   HPI  Tammy Mccall presents to clinic for refills on her levothyroxine and her hypertension medications.  She has no acute concerns today.  Hypertension - She is not checking BP at home or outside of clinic.    - Current medications: hydrochlorothiazide 25mg  daily and lisinopril 10mg  daily, tolerating well without side effects - She is not currently symptomatic. - Pt denies headache, lightheadedness, dizziness, changes in vision, chest tightness/pressure, palpitations, leg swelling, sudden loss of speech or loss of consciousness. - She  reports no regular exercise routine. - Her diet is high in salt, high in fat, and high in carbohydrates.  Depression screen [facility] 2/9 03/24/2019  Decreased Interest 0  Down, Depressed, Hopeless 0  PHQ - 2 Score 0  Altered sleeping 3  Tired, decreased energy 3  Change in appetite 0  Feeling bad or failure about yourself  0  Trouble concentrating 0  Moving slowly or fidgety/restless 0  Suicidal thoughts 0  PHQ-9 Score 6  Difficult doing work/chores Somewhat difficult    Social History   Tobacco Use  . Smoking status: Current Every Day Smoker    Packs/day: 1.00    Years: 50.00    Pack years: 50.00    Types: Cigarettes  . Smokeless tobacco: Never Used  Vaping Use  . Vaping Use: Never used  Substance Use Topics  . Alcohol use: Yes    Alcohol/week: 0.0 standard drinks    Comment: champagne  . Drug use: No    Review of Systems  Constitutional: Negative.   HENT: Negative.   Eyes: Negative.   Respiratory: Negative.   Cardiovascular: Negative.   Gastrointestinal: Negative.   Endocrine: Negative.   Genitourinary: Negative.   Musculoskeletal: Negative.   Skin: Negative.   Allergic/Immunologic: Negative.    Neurological: Negative.   Hematological: Negative.   Psychiatric/Behavioral: Negative.    Per HPI unless specifically indicated above     Objective:    BP 129/81 (BP Location: Right Arm, Patient Position: Sitting, Cuff Size: Normal)   Pulse 79   Temp 98.7 F (37.1 C) (Oral)   Resp 18   Ht 5\' 7"  (1.702 m)   Wt 189 lb (85.7 kg)   LMP  (LMP Unknown)   SpO2 98%   BMI 29.60 kg/m   Wt Readings from Last 3 Encounters:  04/11/20 189 lb (85.7 kg)  09/27/19 185 lb 6.4 oz (84.1 kg)  03/24/19 177 lb 9.6 oz (80.6 kg)    Physical Exam Vitals and nursing note reviewed.  Constitutional:      General: She is not in acute distress.    Appearance: Normal appearance. She is well-developed and well-groomed. She is not ill-appearing or toxic-appearing.  HENT:     Head: Normocephalic and atraumatic.     Nose:     Comments: Lizbeth Bark is in place, covering mouth and nose. Eyes:     General: Lids are normal. Vision grossly intact.        Right eye: No discharge.        Left eye: No discharge.     Extraocular Movements: Extraocular movements intact.     Conjunctiva/sclera: Conjunctivae normal.     Pupils: Pupils are equal, round, and reactive to light.  Cardiovascular:  Rate and Rhythm: Normal rate and regular rhythm.     Pulses: Normal pulses.     Heart sounds: Normal heart sounds. No murmur heard.  No friction rub. No gallop.   Pulmonary:     Effort: Pulmonary effort is normal. No respiratory distress.     Breath sounds: Normal breath sounds.  Musculoskeletal:     Right lower leg: No edema.     Left lower leg: No edema.  Skin:    General: Skin is warm and dry.     Capillary Refill: Capillary refill takes less than 2 seconds.  Neurological:     General: No focal deficit present.     Mental Status: She is alert and oriented to person, place, and time.  Psychiatric:        Attention and Perception: Attention and perception normal.        Mood and Affect: Mood and affect normal.         Speech: Speech normal.        Behavior: Behavior normal. Behavior is cooperative.        Thought Content: Thought content normal.        Cognition and Memory: Cognition and memory normal.        Judgment: Judgment normal.    Results for orders placed or performed in visit on 03/24/19  Comprehensive metabolic panel  Result Value Ref Range   Glucose, Bld 89 65 - 99 mg/dL   BUN 16 7 - 25 mg/dL   Creat 0.58 0.50 - 1.05 mg/dL   BUN/Creatinine Ratio NOT APPLICABLE 6 - 22 (calc)   Sodium 142 135 - 146 mmol/L   Potassium 3.8 3.5 - 5.3 mmol/L   Chloride 105 98 - 110 mmol/L   CO2 26 20 - 32 mmol/L   Calcium 9.1 8.6 - 10.4 mg/dL   Total Protein 6.0 (L) 6.1 - 8.1 g/dL   Albumin 4.2 3.6 - 5.1 g/dL   Globulin 1.8 (L) 1.9 - 3.7 g/dL (calc)   AG Ratio 2.3 1.0 - 2.5 (calc)   Total Bilirubin 0.4 0.2 - 1.2 mg/dL   Alkaline phosphatase (APISO) 60 37 - 153 U/L   AST 15 10 - 35 U/L   ALT 20 6 - 29 U/L  TSH  Result Value Ref Range   TSH 0.89 0.40 - 4.50 mIU/L      Assessment & Plan:   Problem List Items Addressed This Visit      Cardiovascular and Mediastinum   Essential hypertension - Primary    Controlled hypertension.  BP is at goal < 130/80.  Pt is working on lifestyle modifications.  Taking medications tolerating well without side effects.  Complications:  Overweight, hypothyroidism, GERD, tobacco use  Plan: 1. Continue taking hydrochlorothiazide 25mg  daily and lisinopril 10mg  daily 2. Obtain labs in the next 1-2 weeks  3. Encouraged heart healthy diet and increasing exercise to 30 minutes most days of the week, going no more than 2 days in a row without exercise. 4. Check BP 1-2 x per week at home, keep log, and bring to clinic at next appointment. 5. Follow up 6 months.       Relevant Medications   hydrochlorothiazide (HYDRODIURIL) 25 MG tablet   lisinopril (ZESTRIL) 10 MG tablet   Other Relevant Orders   CBC with Differential   COMPLETE METABOLIC PANEL WITH GFR      Endocrine   Acquired hypothyroidism    Status unknown, last labs 03/31/2019 and TSH WNL.  Recheck  labs.  Continue meds without changes today.  Refills provided. Followup after labs.       Relevant Medications   levothyroxine (SYNTHROID) 50 MCG tablet   Other Relevant Orders   TSH + free T4    Other Visit Diagnoses    Long-term use of high-risk medication       Relevant Orders   Lipid Profile      Meds ordered this encounter  Medications  . levothyroxine (SYNTHROID) 50 MCG tablet    Sig: TAKE 1 TABLET(50 MCG) BY MOUTH DAILY BEFORE BREAKFAST    Dispense:  30 tablet    Refill:  0  . hydrochlorothiazide (HYDRODIURIL) 25 MG tablet    Sig: TAKE 1 TABLET(25 MG) BY MOUTH DAILY    Dispense:  30 tablet    Refill:  0  . lisinopril (ZESTRIL) 10 MG tablet    Sig: Take 1 tablet (10 mg total) by mouth daily.    Dispense:  30 tablet    Refill:  0    Follow up plan: Return in about 3 months (around 07/12/2020) for CPE and PAP testing (If not had done by the time of this appt).   Harlin Rain, [place] Family Nurse Practitioner [facility] 04/11/2020, 4:38 PM

## 2020-04-19 ENCOUNTER — Other Ambulatory Visit: Admission: RE | Admit: 2020-04-19 | Payer: BC Managed Care – PPO | Source: Ambulatory Visit

## 2020-04-19 ENCOUNTER — Telehealth: Payer: Self-pay

## 2020-04-19 NOTE — Telephone Encounter (Signed)
Returned patients call. Left patient message regarding cancelling procedure. Procedure has been cancelled. LVM for patient to call office to reschedule.

## 2020-04-23 ENCOUNTER — Ambulatory Visit
Admission: RE | Admit: 2020-04-23 | Payer: BC Managed Care – PPO | Source: Home / Self Care | Admitting: Gastroenterology

## 2020-04-23 ENCOUNTER — Encounter: Admission: RE | Payer: Self-pay | Source: Home / Self Care

## 2020-04-23 SURGERY — COLONOSCOPY WITH PROPOFOL
Anesthesia: General

## 2020-05-07 ENCOUNTER — Other Ambulatory Visit: Payer: Self-pay | Admitting: Family Medicine

## 2020-05-07 DIAGNOSIS — I10 Essential (primary) hypertension: Secondary | ICD-10-CM

## 2020-05-07 DIAGNOSIS — E039 Hypothyroidism, unspecified: Secondary | ICD-10-CM

## 2020-05-07 NOTE — Telephone Encounter (Signed)
Patient called, left detailed message per DPR to return the call to schedule a fasting lab appointment for this week in order to receive medication refills. Noted last OV on 04/11/20 to return in 2-3 weeks for labs, refilled medications at that time for 30 days until labs are done. Refilled x 30 days until labs are done per refill protocol.

## 2020-05-08 ENCOUNTER — Other Ambulatory Visit: Payer: BC Managed Care – PPO

## 2020-05-13 ENCOUNTER — Other Ambulatory Visit: Payer: Self-pay

## 2020-05-13 ENCOUNTER — Other Ambulatory Visit: Payer: BC Managed Care – PPO

## 2020-05-14 ENCOUNTER — Other Ambulatory Visit: Payer: Self-pay | Admitting: Family Medicine

## 2020-05-14 ENCOUNTER — Telehealth: Payer: Self-pay | Admitting: Family Medicine

## 2020-05-14 DIAGNOSIS — E039 Hypothyroidism, unspecified: Secondary | ICD-10-CM

## 2020-05-14 DIAGNOSIS — E782 Mixed hyperlipidemia: Secondary | ICD-10-CM

## 2020-05-14 DIAGNOSIS — I10 Essential (primary) hypertension: Secondary | ICD-10-CM

## 2020-05-14 LAB — CBC WITH DIFFERENTIAL/PLATELET
Absolute Monocytes: 529 cells/uL (ref 200–950)
Basophils Absolute: 17 cells/uL (ref 0–200)
Basophils Relative: 0.2 %
Eosinophils Absolute: 67 cells/uL (ref 15–500)
Eosinophils Relative: 0.8 %
HCT: 46.5 % — ABNORMAL HIGH (ref 35.0–45.0)
Hemoglobin: 16.1 g/dL — ABNORMAL HIGH (ref 11.7–15.5)
Lymphs Abs: 1260 cells/uL (ref 850–3900)
MCH: 33.3 pg — ABNORMAL HIGH (ref 27.0–33.0)
MCHC: 34.6 g/dL (ref 32.0–36.0)
MCV: 96.3 fL (ref 80.0–100.0)
MPV: 11.1 fL (ref 7.5–12.5)
Monocytes Relative: 6.3 %
Neutro Abs: 6527 cells/uL (ref 1500–7800)
Neutrophils Relative %: 77.7 %
Platelets: 264 10*3/uL (ref 140–400)
RBC: 4.83 10*6/uL (ref 3.80–5.10)
RDW: 12.2 % (ref 11.0–15.0)
Total Lymphocyte: 15 %
WBC: 8.4 10*3/uL (ref 3.8–10.8)

## 2020-05-14 LAB — LIPID PANEL
Cholesterol: 225 mg/dL — ABNORMAL HIGH (ref <200)
HDL: 84 mg/dL (ref >=50)
LDL Cholesterol (Calc): 119 mg/dL (calc) — ABNORMAL HIGH
Non-HDL Cholesterol (Calc): 141 mg/dL (calc) — ABNORMAL HIGH (ref <130)
Total CHOL/HDL Ratio: 2.7 (calc) (ref <5.0)
Triglycerides: 108 mg/dL (ref <150)

## 2020-05-14 LAB — COMPLETE METABOLIC PANEL WITH GFR
AG Ratio: 2.2 (calc) (ref 1.0–2.5)
ALT: 24 U/L (ref 6–29)
AST: 17 U/L (ref 10–35)
Albumin: 4.7 g/dL (ref 3.6–5.1)
Alkaline phosphatase (APISO): 72 U/L (ref 37–153)
BUN: 17 mg/dL (ref 7–25)
CO2: 26 mmol/L (ref 20–32)
Calcium: 9.6 mg/dL (ref 8.6–10.4)
Chloride: 101 mmol/L (ref 98–110)
Creat: 0.76 mg/dL (ref 0.50–1.05)
GFR, Est African American: 100 mL/min/{1.73_m2} (ref >=60)
GFR, Est Non African American: 86 mL/min/{1.73_m2} (ref >=60)
Globulin: 2.1 g/dL (calc) (ref 1.9–3.7)
Glucose, Bld: 100 mg/dL — ABNORMAL HIGH (ref 65–99)
Potassium: 3.8 mmol/L (ref 3.5–5.3)
Sodium: 140 mmol/L (ref 135–146)
Total Bilirubin: 0.6 mg/dL (ref 0.2–1.2)
Total Protein: 6.8 g/dL (ref 6.1–8.1)

## 2020-05-14 LAB — TSH+FREE T4: TSH W/REFLEX TO FT4: 1.15 mIU/L (ref 0.40–4.50)

## 2020-05-14 MED ORDER — LISINOPRIL 10 MG PO TABS: 10.0000 mg | ORAL_TABLET | Freq: Every day | ORAL | 1 refills | Status: DC

## 2020-05-14 MED ORDER — ATORVASTATIN CALCIUM 10 MG PO TABS: 10.0000 mg | ORAL_TABLET | Freq: Every day | ORAL | 3 refills | Status: DC

## 2020-05-14 MED ORDER — HYDROCHLOROTHIAZIDE 25 MG PO TABS: ORAL_TABLET | ORAL | 1 refills | Status: DC

## 2020-05-14 MED ORDER — LEVOTHYROXINE SODIUM 50 MCG PO TABS: ORAL_TABLET | ORAL | 1 refills | Status: DC

## 2020-05-14 NOTE — Telephone Encounter (Signed)
Sent to [place] for review.

## 2020-05-14 NOTE — Telephone Encounter (Signed)
-----   Message from Romana Juniper, RN sent at 05/14/2020 12:32 PM EST ----- Patient notified of lab results and PCP recommendations- patient does not want to start statin medication at this time- she states she will work to improve for diet and exercise. Advised would let PCP know.

## 2020-05-28 ENCOUNTER — Other Ambulatory Visit: Payer: Self-pay

## 2020-05-28 DIAGNOSIS — Z86010 Personal history of colonic polyps: Secondary | ICD-10-CM

## 2020-05-28 DIAGNOSIS — K219 Gastro-esophageal reflux disease without esophagitis: Secondary | ICD-10-CM

## 2020-05-28 MED ORDER — PEG 3350-KCL-NA BICARB-NACL 420 G PO SOLR: ORAL | 0 refills | Status: DC

## 2020-06-06 ENCOUNTER — Other Ambulatory Visit: Payer: Self-pay | Admitting: Family Medicine

## 2020-06-06 DIAGNOSIS — E039 Hypothyroidism, unspecified: Secondary | ICD-10-CM

## 2020-06-06 DIAGNOSIS — I10 Essential (primary) hypertension: Secondary | ICD-10-CM

## 2020-06-10 ENCOUNTER — Telehealth: Payer: Self-pay | Admitting: Family Medicine

## 2020-06-10 NOTE — Telephone Encounter (Signed)
error 

## 2020-06-18 ENCOUNTER — Telehealth: Payer: Self-pay

## 2020-06-18 ENCOUNTER — Other Ambulatory Visit: Payer: Self-pay

## 2020-06-18 ENCOUNTER — Other Ambulatory Visit
Admission: RE | Admit: 2020-06-18 | Discharge: 2020-06-18 | Disposition: A | Discharge status: Home / Self Care | Payer: BC Managed Care – PPO | Source: Ambulatory Visit | Attending: Gastroenterology | Admitting: Gastroenterology

## 2020-06-18 DIAGNOSIS — K2970 Gastritis, unspecified, without bleeding: Secondary | ICD-10-CM | POA: Diagnosis not present

## 2020-06-18 DIAGNOSIS — Z79890 Hormone replacement therapy (postmenopausal): Secondary | ICD-10-CM | POA: Diagnosis not present

## 2020-06-18 DIAGNOSIS — K219 Gastro-esophageal reflux disease without esophagitis: Secondary | ICD-10-CM | POA: Diagnosis not present

## 2020-06-18 DIAGNOSIS — Z79899 Other long term (current) drug therapy: Secondary | ICD-10-CM | POA: Diagnosis not present

## 2020-06-18 DIAGNOSIS — Z01812 Encounter for preprocedural laboratory examination: Secondary | ICD-10-CM | POA: Insufficient documentation

## 2020-06-18 DIAGNOSIS — R12 Heartburn: Secondary | ICD-10-CM | POA: Diagnosis not present

## 2020-06-18 DIAGNOSIS — Z9889 Other specified postprocedural states: Secondary | ICD-10-CM | POA: Diagnosis not present

## 2020-06-18 DIAGNOSIS — Z20822 Contact with and (suspected) exposure to covid-19: Secondary | ICD-10-CM | POA: Insufficient documentation

## 2020-06-18 DIAGNOSIS — Z86010 Personal history of colonic polyps: Secondary | ICD-10-CM | POA: Diagnosis not present

## 2020-06-18 DIAGNOSIS — K640 First degree hemorrhoids: Secondary | ICD-10-CM | POA: Diagnosis not present

## 2020-06-18 DIAGNOSIS — Z1211 Encounter for screening for malignant neoplasm of colon: Secondary | ICD-10-CM | POA: Diagnosis not present

## 2020-06-18 DIAGNOSIS — D125 Benign neoplasm of sigmoid colon: Secondary | ICD-10-CM | POA: Diagnosis not present

## 2020-06-18 DIAGNOSIS — F17210 Nicotine dependence, cigarettes, uncomplicated: Secondary | ICD-10-CM | POA: Diagnosis not present

## 2020-06-18 LAB — SARS CORONAVIRUS 2 (TAT 6-24 HRS): SARS Coronavirus 2: NEGATIVE

## 2020-06-18 NOTE — Telephone Encounter (Signed)
Returned patients call.  LVM on her cell and work number.  Pt stated in voice message she has questions regarding her Colon w/EGD scheduled with Dr. Allen Norris 06/20/21.  Thanks,  [place], [place]

## 2020-06-19 ENCOUNTER — Telehealth: Payer: Self-pay | Admitting: Gastroenterology

## 2020-06-19 ENCOUNTER — Encounter: Payer: Self-pay | Admitting: Gastroenterology

## 2020-06-19 NOTE — Telephone Encounter (Signed)
Pt wants to make sure her procedure for tomorrow is for Endoscopy and Colonoscopy, please call

## 2020-06-19 NOTE — Telephone Encounter (Signed)
Left vm informing pt she is having an EGD and Colonoscopy tomorrow.

## 2020-06-20 ENCOUNTER — Encounter: Payer: Self-pay | Admitting: Gastroenterology

## 2020-06-20 ENCOUNTER — Encounter
Admission: RE | Disposition: A | Discharge status: Home / Self Care | Payer: Self-pay | Source: Home / Self Care | Attending: Gastroenterology

## 2020-06-20 ENCOUNTER — Ambulatory Visit
Admission: RE | Admit: 2020-06-20 | Discharge: 2020-06-20 | Disposition: A | Discharge status: Home / Self Care | Payer: BC Managed Care – PPO | Attending: Gastroenterology | Admitting: Gastroenterology

## 2020-06-20 ENCOUNTER — Ambulatory Visit: Payer: BC Managed Care – PPO | Admitting: Anesthesiology

## 2020-06-20 ENCOUNTER — Other Ambulatory Visit: Payer: Self-pay

## 2020-06-20 DIAGNOSIS — K2970 Gastritis, unspecified, without bleeding: Secondary | ICD-10-CM | POA: Insufficient documentation

## 2020-06-20 DIAGNOSIS — Z860100 Personal history of colon polyps, unspecified: Secondary | ICD-10-CM

## 2020-06-20 DIAGNOSIS — K635 Polyp of colon: Secondary | ICD-10-CM | POA: Diagnosis not present

## 2020-06-20 DIAGNOSIS — F17210 Nicotine dependence, cigarettes, uncomplicated: Secondary | ICD-10-CM | POA: Insufficient documentation

## 2020-06-20 DIAGNOSIS — Z79899 Other long term (current) drug therapy: Secondary | ICD-10-CM | POA: Insufficient documentation

## 2020-06-20 DIAGNOSIS — Z1211 Encounter for screening for malignant neoplasm of colon: Secondary | ICD-10-CM | POA: Diagnosis not present

## 2020-06-20 DIAGNOSIS — Z79890 Hormone replacement therapy (postmenopausal): Secondary | ICD-10-CM | POA: Insufficient documentation

## 2020-06-20 DIAGNOSIS — K219 Gastro-esophageal reflux disease without esophagitis: Secondary | ICD-10-CM | POA: Diagnosis not present

## 2020-06-20 DIAGNOSIS — Z20822 Contact with and (suspected) exposure to covid-19: Secondary | ICD-10-CM | POA: Insufficient documentation

## 2020-06-20 DIAGNOSIS — R12 Heartburn: Secondary | ICD-10-CM | POA: Insufficient documentation

## 2020-06-20 DIAGNOSIS — K640 First degree hemorrhoids: Secondary | ICD-10-CM | POA: Insufficient documentation

## 2020-06-20 DIAGNOSIS — Z9889 Other specified postprocedural states: Secondary | ICD-10-CM | POA: Insufficient documentation

## 2020-06-20 DIAGNOSIS — D125 Benign neoplasm of sigmoid colon: Secondary | ICD-10-CM | POA: Insufficient documentation

## 2020-06-20 HISTORY — PX: COLONOSCOPY WITH PROPOFOL: SHX5780

## 2020-06-20 HISTORY — PX: ESOPHAGOGASTRODUODENOSCOPY (EGD) WITH PROPOFOL: SHX5813

## 2020-06-20 SURGERY — COLONOSCOPY WITH PROPOFOL
Anesthesia: General

## 2020-06-20 MED ORDER — PROPOFOL 10 MG/ML IV BOLUS
INTRAVENOUS | Status: DC | PRN
  Administered 2020-06-20: 80 mg via INTRAVENOUS
  Administered 2020-06-20: 50 mg via INTRAVENOUS

## 2020-06-20 MED ORDER — SODIUM CHLORIDE 0.9 % IV SOLN: INTRAVENOUS | Status: DC

## 2020-06-20 MED ORDER — LIDOCAINE HCL (CARDIAC) PF 100 MG/5ML IV SOSY
PREFILLED_SYRINGE | INTRAVENOUS | Status: DC | PRN
  Administered 2020-06-20: 100 mg via INTRAVENOUS

## 2020-06-20 MED ORDER — PROPOFOL 500 MG/50ML IV EMUL
INTRAVENOUS | Status: DC | PRN
  Administered 2020-06-20: 140 ug/kg/min via INTRAVENOUS

## 2020-06-20 NOTE — Op Note (Signed)
[facility] Gastroenterology Patient Name: Tammy Mccall Procedure Date: 06/20/2020 7:18 AM MRN: [id number] Account #: [id number] Date of Birth: April 06, 1961 Admit Type: Outpatient Age: 59 Room: [facility] ENDO ROOM 4 Gender: Female Note Status: Finalized Procedure:             Colonoscopy Indications:           High risk colon cancer surveillance: Personal history                         of colonic polyps Providers:             Lucilla Lame MD, MD Medicines:             Propofol per Anesthesia Complications:         No immediate complications. Procedure:             Pre-Anesthesia Assessment:                        - Prior to the procedure, a History and Physical was                         performed, and patient medications and allergies were                         reviewed. The patient's tolerance of previous                         anesthesia was also reviewed. The risks and benefits                         of the procedure and the sedation options and risks                         were discussed with the patient. All questions were                         answered, and informed consent was obtained. Prior                         Anticoagulants: The patient has taken no previous                         anticoagulant or antiplatelet agents. ASA Grade                         Assessment: II - A patient with mild systemic disease.                         After reviewing the risks and benefits, the patient                         was deemed in satisfactory condition to undergo the                         procedure.                        After obtaining informed consent, the colonoscope was  passed under direct vision. Throughout the procedure,                         the patient's blood pressure, pulse, and oxygen                         saturations were monitored continuously. The                         Colonoscope was introduced through the  anus and                         advanced to the the cecum, identified by appendiceal                         orifice and ileocecal valve. The colonoscopy was                         performed without difficulty. The patient tolerated                         the procedure well. The quality of the bowel                         preparation was excellent. Findings:      The perianal and digital rectal examinations were normal.      Two sessile polyps were found in the sigmoid colon. The polyps were 4 to       6 mm in size. These polyps were removed with a cold snare. Resection and       retrieval were complete.      A 2 mm polyp was found in the sigmoid colon. The polyp was sessile. The       polyp was removed with a cold biopsy forceps. Resection and retrieval       were complete.      A 3 mm polyp was found in the transverse colon. The polyp was sessile.       The polyp was removed with a cold snare. Resection and retrieval were       complete.      Non-bleeding internal hemorrhoids were found during retroflexion. The       hemorrhoids were Grade I (internal hemorrhoids that do not prolapse). Impression:            - Two 4 to 6 mm polyps in the sigmoid colon, removed                         with a cold snare. Resected and retrieved.                        - One 2 mm polyp in the sigmoid colon, removed with a                         cold biopsy forceps. Resected and retrieved.                        - One 3 mm polyp in the transverse colon, removed with  a cold snare. Resected and retrieved.                        - Non-bleeding internal hemorrhoids. Recommendation:        - Discharge patient to home.                        - Resume previous diet.                        - Continue present medications.                        - Await pathology results.                        - Repeat colonoscopy in 5 years for surveillance. Procedure Code(s):     --- Professional  ---                        [phone], Colonoscopy, flexible; with removal of                         tumor(s), polyp(s), or other lesion(s) by snare                         technique                        [id number], 47, Colonoscopy, flexible; with biopsy, single                         or multiple Diagnosis Code(s):     --- Professional ---                        Z86.010, Personal history of colonic polyps                        K63.5, Polyp of colon CPT copyright 2019 American Medical Association. All rights reserved. The codes documented in this report are preliminary and upon coder review may  be revised to meet current compliance requirements. Lucilla Lame MD, MD 06/20/2020 8:20:28 AM This report has been signed electronically. Number of Addenda: 0 Note Initiated On: 06/20/2020 7:18 AM Scope Withdrawal Time: 0 hours 8 minutes 55 seconds  Total Procedure Duration: 0 hours 16 minutes 9 seconds  Estimated Blood Loss:  Estimated blood loss: none.      [facility]

## 2020-06-20 NOTE — Op Note (Signed)
[facility] Gastroenterology Patient Name: Tammy Mccall Procedure Date: 06/20/2020 7:19 AM MRN: [id number] Account #: [id number] Date of Birth: 01-03-1961 Admit Type: Outpatient Age: 59 Room: [facility] ENDO ROOM 4 Gender: Female Note Status: Finalized Procedure:             Upper GI endoscopy Indications:           Heartburn Providers:             Lucilla Lame MD, MD Medicines:             Propofol per Anesthesia Complications:         No immediate complications. Procedure:             Pre-Anesthesia Assessment:                        - Prior to the procedure, a History and Physical was                         performed, and patient medications and allergies were                         reviewed. The patient's tolerance of previous                         anesthesia was also reviewed. The risks and benefits                         of the procedure and the sedation options and risks                         were discussed with the patient. All questions were                         answered, and informed consent was obtained. Prior                         Anticoagulants: The patient has taken no previous                         anticoagulant or antiplatelet agents. ASA Grade                         Assessment: II - A patient with mild systemic disease.                         After reviewing the risks and benefits, the patient                         was deemed in satisfactory condition to undergo the                         procedure.                        After obtaining informed consent, the endoscope was                         passed under direct vision. Throughout the procedure,  the patient's blood pressure, pulse, and oxygen                         saturations were monitored continuously. The Endoscope                         was introduced through the mouth, and advanced to the                         second part of duodenum. The upper  GI endoscopy was                         accomplished without difficulty. The patient tolerated                         the procedure well. Findings:      The examined esophagus was normal.      Evidence of a Nissen fundoplication was found in the gastric fundus. The       wrap appeared intact.      Localized moderate inflammation characterized by erosions was found in       the gastric antrum. Biopsies were taken with a cold forceps for       histology.      The examined duodenum was normal. Impression:            - Normal esophagus.                        - A Nissen fundoplication was found. The wrap appears                         intact.                        - Gastritis. Biopsied.                        - Normal examined duodenum. Recommendation:        - Discharge patient to home.                        - Resume previous diet.                        - Continue present medications.                        - Await pathology results.                        - Perform a colonoscopy today. Procedure Code(s):     --- Professional ---                        [phone], Esophagogastroduodenoscopy, flexible,                         transoral; with biopsy, single or multiple Diagnosis Code(s):     --- Professional ---                        [phone], Other specified postprocedural states  K29.70, Gastritis, unspecified, without bleeding CPT copyright 2019 American Medical Association. All rights reserved. The codes documented in this report are preliminary and upon coder review may  be revised to meet current compliance requirements. Lucilla Lame MD, MD 06/20/2020 7:58:04 AM This report has been signed electronically. Number of Addenda: 0 Note Initiated On: 06/20/2020 7:19 AM Estimated Blood Loss:  Estimated blood loss: none.      [facility]

## 2020-06-20 NOTE — Transfer of Care (Signed)
Immediate Anesthesia Transfer of Care Note  Patient: Tammy Mccall  Procedure(s) Performed: COLONOSCOPY WITH PROPOFOL (N/A ) ESOPHAGOGASTRODUODENOSCOPY (EGD) WITH PROPOFOL (N/A )  Patient Location: PACU  Anesthesia Type:General  Level of Consciousness: sedated  Airway & Oxygen Therapy: Patient Spontanous Breathing and Patient connected to face mask oxygen  Post-op Assessment: Report given to RN and Post -op Vital signs reviewed and stable  Post vital signs: Reviewed and stable  Last Vitals:  Vitals Value Taken Time  BP 101/65 06/20/20 0821  Temp 36.1 C 06/20/20 0819  Pulse 65 06/20/20 0820  Resp 15 06/20/20 0820  SpO2 100 % 06/20/20 0820  Vitals shown include unvalidated device data.  Last Pain:  Vitals:   06/20/20 0819  TempSrc:   PainSc: 0-No pain         Complications: No complications documented.

## 2020-06-20 NOTE — Anesthesia Preprocedure Evaluation (Signed)
Anesthesia Evaluation  Patient identified by MRN, date of birth, ID band Patient awake    Reviewed: Allergy & Precautions, NPO status , Patient's Chart, lab work & pertinent test results  History of Anesthesia Complications Negative for: history of anesthetic complications  Airway Mallampati: II  TM Distance: >3 FB Neck ROM: Full    Dental   Pulmonary Current Smoker,    Pulmonary exam normal        Cardiovascular hypertension, Pt. on medications Normal cardiovascular exam     Neuro/Psych negative neurological ROS  negative psych ROS   GI/Hepatic Neg liver ROS, GERD  Controlled and Medicated,  Endo/Other  Hypothyroidism   Renal/GU negative Renal ROS  negative genitourinary   Musculoskeletal negative musculoskeletal ROS (+)   Abdominal   Peds negative pediatric ROS (+)  Hematology negative hematology ROS (+)   Anesthesia Other Findings Past Medical History: No date: Colon polyps No date: GERD (gastroesophageal reflux disease)     Comment:  tx maalox qhs No date: Hypertension No date: Hypothyroidism No date: Seasonal allergies No date: SVD (spontaneous vaginal delivery)     Comment:  x 1 No date: Thyroid disease  Reproductive/Obstetrics                             Anesthesia Physical  Anesthesia Plan  ASA: II  Anesthesia Plan: General   Post-op Pain Management:    Induction: Intravenous  PONV Risk Score and Plan: Propofol infusion  Airway Management Planned: Nasal Cannula  Additional Equipment:   Intra-op Plan:   Post-operative Plan:   Informed Consent: I have reviewed the patients History and Physical, chart, labs and discussed the procedure including the risks, benefits and alternatives for the proposed anesthesia with the patient or authorized representative who has indicated his/her understanding and acceptance.     Dental advisory given  Plan Discussed with:  CRNA  Anesthesia Plan Comments:         Anesthesia Quick Evaluation

## 2020-06-20 NOTE — H&P (Signed)
Tammy Lame, MD [address], [place] [address] [phone] Fax : [phone]  Primary Care Physician:  Verl Bangs, FNP Primary Gastroenterologist:  Dr. [place] Norris  Pre-Procedure History & Physical: HPI:  Tammy Mccall is a 59 y.o. female is here for an endoscopy and colonoscopy.   Past Medical History:  Diagnosis Date  . Colon polyps   . GERD (gastroesophageal reflux disease)    tx maalox qhs  . Hypertension   . Hypothyroidism   . Seasonal allergies   . SVD (spontaneous vaginal delivery)    x 1  . Thyroid disease     Past Surgical History:  Procedure Laterality Date  . CESAREAN SECTION  1990's   x 1 Twins  . CHOLECYSTECTOMY  2004  . COLONOSCOPY  08/2013  . MIDDLE EAR SURGERY     drum rupture  . MULTIPLE TOOTH EXTRACTIONS     all teeth - dentures  . NECK SURGERY    . NECK SURGERY    . stomach surgery    . STOMACH SURGERY    . THORACIC DISCECTOMY Right 12/02/2018   Procedure: Microdiscectomy - T12-L1 - L2-L3 - right;  Surgeon: Kary Kos, MD;  Location: [facility];  Service: Neurosurgery;  Laterality: Right;  . TONSILLECTOMY    . TUBAL LIGATION      Prior to Admission medications   Medication Sig Start Date End Date Taking? Authorizing Provider  hydrochlorothiazide (HYDRODIURIL) 25 MG tablet TAKE 1 TABLET(25 MG) BY MOUTH DAILY. 05/14/20  Yes Malfi, Lupita Raider, FNP  levothyroxine (SYNTHROID) 50 MCG tablet TAKE 1 TABLET(50 MCG) BY MOUTH DAILY BEFORE BREAKFAST. 05/14/20  Yes Malfi, Lupita Raider, FNP  aluminum-magnesium hydroxide-simethicone (MAALOX) 200-200-20 MG/5ML SUSP Take 15 mLs by mouth at bedtime.     [provider]  atorvastatin (LIPITOR) 10 MG tablet Take 1 tablet (10 mg total) by mouth daily. Patient not taking: Reported on 06/20/2020 05/14/20   Verl Bangs, FNP  esomeprazole (NEXIUM) 20 MG capsule Take 20 mg by mouth daily at 12 noon.    [provider]  lisinopril (ZESTRIL) 10 MG tablet Take 1 tablet (10 mg  total) by mouth daily. Patient not taking: Reported on 06/20/2020 05/14/20   Verl Bangs, FNP  methocarbamol (ROBAXIN) 500 MG tablet Take 1 tablet (500 mg total) by mouth 4 (four) times daily. Patient not taking: Reported on 08/01/2019 12/02/18   Meyran, Ocie Cornfield, NP  Multiple Vitamins-Minerals (EMERGEN-C IMMUNE PO) Take 3 tablets by mouth daily at 3 pm.    [provider]  polyethylene glycol-electrolytes (GAVILYTE-N WITH FLAVOR PACK) 420 g solution Drink one 8 oz glass every 20 mins until entire container is finished starting at 5:00pm on 06/19/20 05/28/20   Tammy Lame, MD  sucralfate (CARAFATE) 1 GM/10ML suspension TAKE 10 MLS BY MOUTH FOUR TIMES DAILY(WITH MEALS AND AT BEDTIME) AS NEEDED ONLY Patient not taking: Reported on 04/11/2020 08/22/19   Olin Hauser, DO    Allergies as of 05/28/2020  . (No Known Allergies)    Family History  Problem Relation Age of Onset  . Hashimoto's thyroiditis Mother   . Alzheimer's disease Mother   . Gallbladder disease Father   . Arthritis Sister   . Heart disease Brother   . Diabetes Brother   . Arthritis Brother   . Arthritis Brother   . Drug abuse Daughter     Social History   Socioeconomic History  . Marital status: Married    Spouse name: Not  on file  . Number of children: 3  . Years of education: Not on file  . Highest education level: Not on file  Occupational History  . Not on file  Tobacco Use  . Smoking status: Current Every Day Smoker    Packs/day: 1.00    Years: 50.00    Pack years: 50.00    Types: Cigarettes  . Smokeless tobacco: Never Used  Vaping Use  . Vaping Use: Never used  Substance and Sexual Activity  . Alcohol use: Yes    Alcohol/week: 0.0 standard drinks    Comment: champagne  . Drug use: No  . Sexual activity: Not on file  Other Topics Concern  . Not on file  Social History Narrative  . Not on file   Social Determinants of Health   Financial Resource Strain: Not on file   Food Insecurity: Not on file  Transportation Needs: Not on file  Physical Activity: Not on file  Stress: Not on file  Social Connections: Not on file  Intimate Partner Violence: Not on file    Review of Systems: See HPI, otherwise negative ROS  Physical Exam: BP (!) 147/94   Pulse 81   Temp (!) 97.2 F (36.2 C) (Temporal)   Resp 20   Ht 5\' 7"  (1.702 m)   Wt 86.2 kg   LMP  (LMP Unknown)   SpO2 100%   BMI 29.76 kg/m  General:   Alert,  pleasant and cooperative in NAD Head:  Normocephalic and atraumatic. Neck:  Supple; no masses or thyromegaly. Lungs:  Clear throughout to auscultation.    Heart:  Regular rate and rhythm. Abdomen:  Soft, nontender and nondistended. Normal bowel sounds, without guarding, and without rebound.   Neurologic:  Alert and  oriented x4;  grossly normal neurologically.  Impression/Plan: Tammy Mccall is here for an endoscopy and colonoscopy to be performed for a history of adenomatous polyps on 09/2013 and GERD   Risks, benefits, limitations, and alternatives regarding  endoscopy and colonoscopy have been reviewed with the patient.  Questions have been answered.  All parties agreeable.   Tammy Lame, MD  06/20/2020, 7:32 AM

## 2020-06-20 NOTE — Anesthesia Postprocedure Evaluation (Signed)
Anesthesia Post Note  Patient: Tammy Mccall  Procedure(s) Performed: COLONOSCOPY WITH PROPOFOL (N/A ) ESOPHAGOGASTRODUODENOSCOPY (EGD) WITH PROPOFOL (N/A )  Patient location during evaluation: Endoscopy Anesthesia Type: General Level of consciousness: awake and alert and oriented Pain management: pain level controlled Vital Signs Assessment: post-procedure vital signs reviewed and stable Respiratory status: spontaneous breathing Cardiovascular status: blood pressure returned to baseline Anesthetic complications: no   No complications documented.   Last Vitals:  Vitals:   06/20/20 0829 06/20/20 0839  BP: 118/66 112/71  Pulse: 72 69  Resp: (!) 23 17  Temp:    SpO2: 99% 100%    Last Pain:  Vitals:   06/20/20 0839  TempSrc:   PainSc: 0-No pain                 Zian Delair

## 2020-06-21 LAB — SURGICAL PATHOLOGY

## 2020-06-23 ENCOUNTER — Encounter: Payer: Self-pay | Admitting: Gastroenterology

## 2020-08-14 ENCOUNTER — Ambulatory Visit: Payer: BC Managed Care – PPO | Admitting: Family Medicine

## 2020-08-14 ENCOUNTER — Other Ambulatory Visit: Payer: Self-pay

## 2020-08-14 ENCOUNTER — Encounter: Payer: Self-pay | Admitting: Family Medicine

## 2020-08-14 ENCOUNTER — Telehealth: Payer: Self-pay

## 2020-08-14 VITALS — BP 159/72 | HR 98 | Ht 65.0 in | Wt 184.4 lb

## 2020-08-14 DIAGNOSIS — B379 Candidiasis, unspecified: Secondary | ICD-10-CM | POA: Diagnosis not present

## 2020-08-14 DIAGNOSIS — H60501 Unspecified acute noninfective otitis externa, right ear: Secondary | ICD-10-CM | POA: Diagnosis not present

## 2020-08-14 DIAGNOSIS — T3695XA Adverse effect of unspecified systemic antibiotic, initial encounter: Secondary | ICD-10-CM

## 2020-08-14 MED ORDER — HYDROCORTISONE-ACETIC ACID 1-2 % OT SOLN: 3.0000 [drp] | Freq: Three times a day (TID) | OTIC | 0 refills | Status: DC

## 2020-08-14 MED ORDER — FLUCONAZOLE 200 MG PO TABS: 200.0000 mg | ORAL_TABLET | Freq: Every day | ORAL | 1 refills | Status: DC

## 2020-08-14 NOTE — Patient Instructions (Addendum)
Thank you for coming to the office today.  Use steroid ear drops can use on both sides  Take yeast pill for 1 week can repeat 1 more week if needed.  Future may need a different ENT let me know if you need a referral.  Please schedule a Follow-up Appointment to: Return in about 4 weeks (around 09/11/2020), or if symptoms worsen or fail to improve.  If you have any other questions or concerns, please feel free to call the office or send a message through [facility]. You may also schedule an earlier appointment if necessary.  Additionally, you may be receiving a survey about your experience at our office within a few days to 1 week by e-mail or mail. We value your feedback.  Nobie Putnam, DO [place]

## 2020-08-14 NOTE — Telephone Encounter (Signed)
Copied from [facility] [id number]. Topic: General - Other >> Aug 14, 2020  9:44 AM Leward Quan A wrote: Reason for CRM: Patient called in to say that she need to see a doctor because she have something going on in her right ear and she was given an antibiotics but it is not helping. Asking for a call back at  Ph# [phone] or [phone]

## 2020-08-14 NOTE — Progress Notes (Signed)
Subjective:    Patient ID: Tammy Mccall, female    DOB: 1960-07-07, 60 y.o.   MRN: [id number]  Tammy Mccall is a 60 y.o. female presenting on 08/14/2020 for Ear Pain  Previous PCP Cyndia Skeeters, FNP   HPI   R Ear Pain / Otitis Externa Reports symptoms for past 1 month with R ear pain. Telemedicine was done about 1-2 weeks ago, she was given Augmentin antibiotic and ear drops She admits difficulty with R ear in past, she had R ear drum surgery in 2010. She has history of recurrent sinus and ear infections in past. She admits pain and pressure When pressure in R ear she has difficulty with hearing. She uses Flonase every day in AM. She started Mucinex yesterday Previously seen by Dr Tami Ribas 2010 has a balance of $500 and cannot return to [facility]  Denies fever chills, headache, nausea vomiting, sinusitis  Depression screen [facility] 2/9 03/24/2019  Decreased Interest 0  Down, Depressed, Hopeless 0  PHQ - 2 Score 0  Altered sleeping 3  Tired, decreased energy 3  Change in appetite 0  Feeling bad or failure about yourself  0  Trouble concentrating 0  Moving slowly or fidgety/restless 0  Suicidal thoughts 0  PHQ-9 Score 6  Difficult doing work/chores Somewhat difficult    Social History   Tobacco Use  . Smoking status: Current Every Day Smoker    Packs/day: 1.00    Years: 50.00    Pack years: 50.00    Types: Cigarettes  . Smokeless tobacco: Never Used  Vaping Use  . Vaping Use: Never used  Substance Use Topics  . Alcohol use: Yes    Alcohol/week: 0.0 standard drinks    Comment: champagne  . Drug use: No    Review of Systems Per HPI unless specifically indicated above     Objective:    BP (!) 159/72   Pulse 98   Ht 5\' 5"  (1.651 m)   Wt 184 lb 6.4 oz (83.6 kg)   LMP  (LMP Unknown)   SpO2 98%   BMI 30.69 kg/m   Wt Readings from Last 3 Encounters:  08/14/20 184 lb 6.4 oz (83.6 kg)  06/20/20 190 lb (86.2 kg)  04/11/20 189 lb (85.7 kg)     Physical Exam Results for orders placed or performed during the hospital encounter of 06/20/20  Surgical pathology  Result Value Ref Range   SURGICAL PATHOLOGY      SURGICAL PATHOLOGY CASE: [id number] PATIENT: [facility] Surgical Pathology Report     Specimen Submitted: A. Stomach, antrum; cbx B. Colon polyp x2, sigmoid; cs C. Colon polyp, transverse; cs D. Colon polyp, sigmoid; cbx  Clinical History: History of colon polyps Z86.010 GERD K21.9, Erosive antrum gastritis; colon polyps      DIAGNOSIS: A. STOMACH, ANTRUM; COLD BIOPSY: - ANTRAL MUCOSA WITH REACTIVE GASTRITIS. - NEGATIVE FOR H. PYLORI, DYSPLASIA, AND MALIGNANCY.  B.  COLON POLYP X2, SIGMOID; COLD SNARE: - HYPERPLASTIC POLYP (3). - NEGATIVE FOR DYSPLASIA AND MALIGNANCY.  C.  COLON POLYP, TRANSVERSE; COLD SNARE: - HYPERPLASTIC POLYP. - NEGATIVE FOR DYSPLASIA AND MALIGNANCY.  D.  COLON POLYP, SIGMOID; COLD BIOPSY: - TUBULAR ADENOMA. - NEGATIVE FOR HIGH-GRADE DYSPLASIA AND MALIGNANCY.  GROSS DESCRIPTION: A. Labeled: cbx antrum gastritis Received: Formalin Tissue fragment(s): 3 Size: Aggregate, 0.9 x 0.4 x 0.2 cm Description: Tan soft tissue  fragments Entirely submitted in 1 cassette.  B. Labeled: Cold snare polyp sigmoid colon x2 Received: Formalin Tissue fragment(s):  Multiple Size: Aggregate, 0.8 x 0.6 x 0.2 cm Description: Received are 2 fragments of tan soft tissue admixed with fecal matter.  The ratio of soft tissue to fecal matter is 90: 10. Entirely submitted in 1 cassette.  C. Labeled: Cold snare polyp transverse colon Received: Formalin Tissue fragment(s): 1 Size: 0.4 x 0.4 x 0.1 cm Description: White translucent soft tissue fragment Entirely submitted in 1 cassette.  D. Labeled: cbx polyp sigmoid colon Received: Formalin Tissue fragment(s): 1 Size: 0.4 x 0.3 x 0.2 cm Description: Tan soft tissue fragment Entirely submitted in 1 cassette.    Final Diagnosis performed  by Quay Burow, MD.   Electronically signed 06/21/2020 12:53:35PM The electronic signature indicates that the named Attending Pathologist has evaluated the specimen Technical component performed at [facility], [address], [place], [place] [address] Lab: [phone] Dir: Rush Farmer, MD, MMM  Professional component performed at [facility], [facility], [place], [place], [place] [address] Lab: [phone] Dir: Dellia Nims. Rubinas, MD       Assessment & Plan:   Problem List Items Addressed This Visit   None   Visit Diagnoses    Acute otitis externa of right ear, unspecified type    -  Primary   Relevant Medications   fluconazole (DIFLUCAN) 200 MG tablet   acetic acid-hydrocortisone (VOSOL-HC) OTIC solution   Antibiotic-induced yeast infection       Relevant Medications   fluconazole (DIFLUCAN) 200 MG tablet      Clinically with otitis externa / debris R ear canal, consider fungal component Recent augmentin did not resolve Not characteristic of AOM or sinusitis Chronic ear issues in past TM surgery >10 yr ago, has balance at [facility] cannot go back Trial on ear drops acetic acid / cortisone for otitis externa and add oral diflucan Follow up if not improving can add oral prednisone or refer to other ENT  Meds ordered this encounter  Medications  . fluconazole (DIFLUCAN) 200 MG tablet    Sig: Take 1 tablet (200 mg total) by mouth daily. For 1 week    Dispense:  7 tablet    Refill:  1  . acetic acid-hydrocortisone (VOSOL-HC) OTIC solution    Sig: Place 3 drops into both ears 3 (three) times daily.    Dispense:  10 mL    Refill:  0      Follow up plan: Return in about 4 weeks (around 09/11/2020), or if symptoms worsen or fail to improve.   Nobie Putnam, [facility] 08/14/2020, 3:18 PM

## 2020-08-14 NOTE — Telephone Encounter (Signed)
The pt was notified at this time no appt available, but if we have any cancellations or [place] we will give her a call. She verbalize understanding.

## 2020-08-20 ENCOUNTER — Encounter: Payer: Self-pay | Admitting: Gastroenterology

## 2020-08-20 ENCOUNTER — Ambulatory Visit: Payer: BC Managed Care – PPO | Admitting: Gastroenterology

## 2020-12-09 ENCOUNTER — Other Ambulatory Visit: Payer: Self-pay | Admitting: Internal Medicine

## 2020-12-09 DIAGNOSIS — I10 Essential (primary) hypertension: Secondary | ICD-10-CM

## 2020-12-09 DIAGNOSIS — E039 Hypothyroidism, unspecified: Secondary | ICD-10-CM

## 2020-12-09 MED ORDER — LEVOTHYROXINE SODIUM 50 MCG PO TABS: ORAL_TABLET | ORAL | 0 refills | Status: DC

## 2020-12-09 MED ORDER — HYDROCHLOROTHIAZIDE 25 MG PO TABS: ORAL_TABLET | ORAL | 0 refills | Status: DC

## 2020-12-09 NOTE — Telephone Encounter (Signed)
Medication Refill - Medication: Levothyroxine, Hydrochlorothiazide   Has the patient contacted their pharmacy? Yes.  PT states that the pharmacy has contacted office with no response. Pt states that she is completely out of her BP medication. Please advise.  (Agent: If no, request that the patient contact the pharmacy for the refill.) (Agent: If yes, when and what did the pharmacy advise?)  Preferred Pharmacy (with phone number or street name):  [facility] [address], [address]  [place] [address]  Phone: [phone] Fax: [phone]  Hours: Not open 24 hours     Agent: Please be advised that RX refills may take up to 3 business days. We ask that you follow-up with your pharmacy.

## 2020-12-09 NOTE — Telephone Encounter (Signed)
Notes to clinic:  Patient has appt on 12/12/2020 with new provider  Patient is currently out of meds Review for refill    Requested Prescriptions  Pending Prescriptions Disp Refills   levothyroxine (SYNTHROID) 50 MCG tablet 90 tablet 1    Sig: TAKE 1 TABLET(50 MCG) BY MOUTH DAILY BEFORE BREAKFAST.      Endocrinology:  Hypothyroid Agents Failed - 12/09/2020  9:34 AM      Failed - TSH needs to be rechecked within 3 months after an abnormal result. Refill until TSH is due.      Passed - TSH in normal range and within 360 days    TSH  Date Value Ref Range Status  03/31/2019 0.89 0.40 - 4.50 mIU/L Final          Passed - Valid encounter within last 12 months    Recent Outpatient Visits           3 months ago Acute otitis externa of right ear, unspecified type   [place], DO   8 months ago Essential hypertension   [place], [place]   1 year ago Epigastric pain   [facility] Olin Hauser, DO   1 year ago Essential hypertension   [place], NP       Future Appointments             In 2 days Ralene Bathe, MD [place]   In 3 days [place], Coralie Keens, NP [facility]               hydrochlorothiazide (HYDRODIURIL) 25 MG tablet 90 tablet 1    Sig: TAKE 1 TABLET(25 MG) BY MOUTH DAILY.      Cardiovascular: Diuretics - Thiazide Failed - 12/09/2020  9:34 AM      Failed - Last BP in normal range    BP Readings from Last 1 Encounters:  08/14/20 (!) 159/72          Passed - Ca in normal range and within 360 days    Calcium  Date Value Ref Range Status  05/13/2020 9.6 8.6 - 10.4 mg/dL Final          Passed - Cr in normal range and within 360 days    Creat  Date Value Ref Range Status  05/13/2020 0.76 0.50 - 1.05 mg/dL Final    Comment:    For patients >70 years of age, the reference limit for  Creatinine is approximately 13% higher for people identified as African-American. .           Passed - K in normal range and within 360 days    Potassium  Date Value Ref Range Status  05/13/2020 3.8 3.5 - 5.3 mmol/L Final          Passed - Na in normal range and within 360 days    Sodium  Date Value Ref Range Status  05/13/2020 140 135 - 146 mmol/L Final          Passed - Valid encounter within last 6 months    Recent Outpatient Visits           3 months ago Acute otitis externa of right ear, unspecified type   [place], DO   8 months ago Essential hypertension   [facility], Lupita Raider, [place]  1 year ago Epigastric pain   [facility] Olin Hauser, DO   1 year ago Essential hypertension   [place], Jerrel Ivory, NP       Future Appointments             In 2 days Ralene Bathe, MD [place]   In 3 days [place], Coralie Keens, NP [facility], [facility]

## 2020-12-11 ENCOUNTER — Ambulatory Visit: Payer: BC Managed Care – PPO | Admitting: Dermatology

## 2020-12-11 ENCOUNTER — Other Ambulatory Visit: Payer: Self-pay

## 2020-12-12 ENCOUNTER — Encounter: Payer: Self-pay | Admitting: Internal Medicine

## 2020-12-12 ENCOUNTER — Other Ambulatory Visit: Payer: Self-pay

## 2020-12-12 ENCOUNTER — Ambulatory Visit: Payer: BC Managed Care – PPO | Admitting: Internal Medicine

## 2020-12-12 VITALS — BP 136/72 | HR 94 | Temp 97.9°F | Resp 17 | Wt 186.2 lb

## 2020-12-12 DIAGNOSIS — E6609 Other obesity due to excess calories: Secondary | ICD-10-CM

## 2020-12-12 DIAGNOSIS — J3089 Other allergic rhinitis: Secondary | ICD-10-CM

## 2020-12-12 DIAGNOSIS — F17200 Nicotine dependence, unspecified, uncomplicated: Secondary | ICD-10-CM

## 2020-12-12 DIAGNOSIS — Z6830 Body mass index (BMI) 30.0-30.9, adult: Secondary | ICD-10-CM

## 2020-12-12 NOTE — Patient Instructions (Signed)

## 2020-12-12 NOTE — Progress Notes (Signed)
Subjective:    Patient ID: Tammy Mccall, female    DOB: 03-Nov-1960, 60 y.o.   MRN: [id number]  HPI  Patient presents the clinic today with complaint of headache, facial pressure, nasal congestion and cough.  She reports this started 3 days ago.  The headache is located behind her eyes.  She denies visual changes or dizziness.  She is not blowing anything out of her nose.  The cough is productive of green mucus.  She denies runny nose, ear pain, sore throat or shortness of breath.  She denies fever, chills or body aches.  She reports symptoms started shortly after working out in the field with her horses, feeding them with hay which she is allergic to.  She is taking Flonase daily.  [facility] and feels like the symptoms are the same.  She is a smoker but denies history of COPD.  She did take half a tablet of Augmentin daily for the last 3 days.  She has not had exposure to COVID that she is aware of.  She has had a negative COVID test at home.  Review of Systems     Past Medical History:  Diagnosis Date   Colon polyps    GERD (gastroesophageal reflux disease)    tx maalox qhs   Hypertension    Hypothyroidism    Seasonal allergies    SVD (spontaneous vaginal delivery)    x 1   Thyroid disease     Current Outpatient Medications  Medication Sig Dispense Refill   acetic acid-hydrocortisone (VOSOL-HC) OTIC solution Place 3 drops into both ears 3 (three) times daily. 10 mL 0   aluminum-magnesium hydroxide-simethicone (MAALOX) 654-650-35 MG/5ML SUSP Take 15 mLs by mouth at bedtime.      atorvastatin (LIPITOR) 10 MG tablet Take 1 tablet (10 mg total) by mouth daily. (Patient not taking: No sig reported) 90 tablet 3   esomeprazole (NEXIUM) 20 MG capsule Take 20 mg by mouth daily at 12 noon.     fluconazole (DIFLUCAN) 200 MG tablet Take 1 tablet (200 mg total) by mouth daily. For 1 week 7 tablet 1   hydrochlorothiazide  (HYDRODIURIL) 25 MG tablet TAKE 1 TABLET(25 MG) BY MOUTH DAILY. 90 tablet 0   levothyroxine (SYNTHROID) 50 MCG tablet TAKE 1 TABLET(50 MCG) BY MOUTH DAILY BEFORE BREAKFAST. 90 tablet 0   lisinopril (ZESTRIL) 10 MG tablet Take 1 tablet (10 mg total) by mouth daily. (Patient not taking: No sig reported) 90 tablet 1   methocarbamol (ROBAXIN) 500 MG tablet Take 1 tablet (500 mg total) by mouth 4 (four) times daily. (Patient not taking: No sig reported) 30 tablet 0   Multiple Vitamins-Minerals (EMERGEN-C IMMUNE PO) Take 3 tablets by mouth daily at 3 pm.     polyethylene glycol-electrolytes (GAVILYTE-N WITH FLAVOR PACK) 420 g solution Drink one 8 oz glass every 20 mins until entire container is finished starting at 5:00pm on 06/19/20 4000 mL 0   sucralfate (CARAFATE) 1 GM/10ML suspension TAKE 10 MLS BY MOUTH FOUR TIMES DAILY(WITH MEALS AND AT BEDTIME) AS NEEDED ONLY (Patient not taking: No sig reported) 420 mL 0   No current facility-administered medications for this visit.    No Known Allergies  Family History  Problem Relation Age of Onset   Hashimoto's thyroiditis Mother    Alzheimer's disease Mother    Gallbladder disease Father    Arthritis Sister    Heart disease Brother  Diabetes Brother    Arthritis Brother    Arthritis Brother    Drug abuse Daughter     Social History   Socioeconomic History   Marital status: Married    Spouse name: Not on file   Number of children: 3   Years of education: Not on file   Highest education level: Not on file  Occupational History   Not on file  Tobacco Use   Smoking status: Every Day    Packs/day: 1.00    Years: 50.00    Pack years: 50.00    Types: Cigarettes   Smokeless tobacco: Never  Vaping Use   Vaping Use: Never used  Substance and Sexual Activity   Alcohol use: Yes    Alcohol/week: 0.0 standard drinks    Comment: champagne   Drug use: No   Sexual activity: Not on file  Other Topics Concern   Not on file  Social History  Narrative   Not on file   Social Determinants of Health   Financial Resource Strain: Not on file  Food Insecurity: Not on file  Transportation Needs: Not on file  Physical Activity: Not on file  Stress: Not on file  Social Connections: Not on file  Intimate Partner Violence: Not on file     Constitutional: Patient reports headache.  Denies fever, malaise, fatigue, or abrupt weight changes.  HEENT: Patient reports facial pressure, nasal congestion.  Denies eye pain, eye redness, ear pain, ringing in the ears, wax buildup, runny nose, bloody nose, or sore throat. Respiratory: Patient reports cough with sputum production.  Denies difficulty breathing, shortness of breath.   Cardiovascular: Denies chest pain, chest tightness, palpitations or swelling in the hands or feet.   No other specific complaints in a complete review of systems (except as listed in HPI above).  Objective:   Physical Exam BP 136/72 (BP Location: Right Arm, Patient Position: Sitting, Cuff Size: Normal)   Pulse 94   Temp 97.9 F (36.6 C) (Temporal)   Resp 17   Wt 186 lb 3.2 oz (84.5 kg)   LMP  (LMP Unknown)   SpO2 98%   BMI 30.99 kg/m   Wt Readings from Last 3 Encounters:  08/14/20 184 lb 6.4 oz (83.6 kg)  06/20/20 190 lb (86.2 kg)  04/11/20 189 lb (85.7 kg)    General: Appears t her, stated age, obese, in NAD. HEENT: Head: normal shape and size no maxillary or facial pressure noted; Eyes: sclera white and EOMs intact;  Neck: No adenopathy noted. Cardiovascular: Normal rate and rhythm. S1,S2 noted.  No murmur, rubs or gallops noted.  Pulmonary/Chest: Normal effort and positive vesicular breath sounds. No respiratory distress. No wheezes, rales or ronchi noted.  Neurological: Alert and oriented.  BMET    Component Value Date/Time   NA 140 05/13/2020 0928   K 3.8 05/13/2020 0928   CL 101 05/13/2020 0928   CO2 26 05/13/2020 0928   GLUCOSE 100 (H) 05/13/2020 0928   BUN 17 05/13/2020 0928    CREATININE 0.76 05/13/2020 0928   CALCIUM 9.6 05/13/2020 0928   GFRNONAA 86 05/13/2020 0928   GFRAA 100 05/13/2020 0928    Lipid Panel     Component Value Date/Time   CHOL 225 (H) 05/13/2020 0928   TRIG 108 05/13/2020 0928   HDL 84 05/13/2020 0928   CHOLHDL 2.7 05/13/2020 0928   LDLCALC 119 (H) 05/13/2020 0928    CBC    Component Value Date/Time   WBC 8.4 05/13/2020 9323  RBC 4.83 05/13/2020 0928   HGB 16.1 (H) 05/13/2020 0928   HCT 46.5 (H) 05/13/2020 0928   PLT 264 05/13/2020 0928   MCV 96.3 05/13/2020 0928   MCH 33.3 (H) 05/13/2020 0928   MCHC 34.6 05/13/2020 0928   RDW 12.2 05/13/2020 0928   LYMPHSABS 1,260 05/13/2020 0928   EOSABS 67 05/13/2020 0928   BASOSABS 17 05/13/2020 0928    Hgb A1C No results found for: HGBA1C          Assessment & Plan:   Acute Headache, Facial Pressure, Nasal Congestion and Cough:  No indication of active infection at this time No indication for antibiotics or steroids She is taking Flonase, advised her to continue this Encouraged Zyrtec daily for the next 7 to 10 days Offered chest x-ray since she is a smoker to evaluate for COPD/chronic bronchitis but she declines  Return precautions discussed   Webb Silversmith, NP This visit occurred during the SARS-CoV-2 public health emergency.  Safety protocols were in place, including screening questions prior to the visit, additional usage of staff PPE, and extensive cleaning of exam room while observing appropriate contact time as indicated for disinfecting solutions.

## 2020-12-26 ENCOUNTER — Encounter: Payer: Self-pay | Admitting: Internal Medicine

## 2020-12-26 ENCOUNTER — Other Ambulatory Visit: Payer: Self-pay

## 2020-12-26 ENCOUNTER — Ambulatory Visit (INDEPENDENT_AMBULATORY_CARE_PROVIDER_SITE_OTHER): Payer: BC Managed Care – PPO | Admitting: Internal Medicine

## 2020-12-26 VITALS — BP 142/82 | HR 82 | Temp 97.5°F | Resp 18 | Ht 65.0 in | Wt 184.0 lb

## 2020-12-26 DIAGNOSIS — R590 Localized enlarged lymph nodes: Secondary | ICD-10-CM | POA: Diagnosis not present

## 2020-12-26 DIAGNOSIS — H6982 Other specified disorders of Eustachian tube, left ear: Secondary | ICD-10-CM | POA: Diagnosis not present

## 2020-12-26 MED ORDER — PREDNISONE 10 MG PO TABS: 10.0000 mg | ORAL_TABLET | Freq: Every day | ORAL | 0 refills | Status: DC

## 2020-12-26 NOTE — Progress Notes (Signed)
Subjective:    Patient ID: Tammy Mccall, female    DOB: March 10, 1961, 60 y.o.   MRN: [id number]  HPI  Pt presents to the clinic today with c/o a knot on the left side of her jaw. She noticed this 3 days ago. She reports the area is tender to touch. She has had some discomfort in her left ear but denies facial pressure, nasal congestion, runny nose or sore throat. She denies fever, chills or body aches. She has taken Flonase OTC with minimal improvement in symptoms.  Review of Systems     Past Medical History:  Diagnosis Date   Colon polyps    GERD (gastroesophageal reflux disease)    tx maalox qhs   Hypertension    Hypothyroidism    Seasonal allergies    SVD (spontaneous vaginal delivery)    x 1   Thyroid disease     Current Outpatient Medications  Medication Sig Dispense Refill   acetic acid-hydrocortisone (VOSOL-HC) OTIC solution Place 3 drops into both ears 3 (three) times daily. 10 mL 0   aluminum-magnesium hydroxide-simethicone (MAALOX) 174-944-96 MG/5ML SUSP Take 15 mLs by mouth at bedtime.      atorvastatin (LIPITOR) 10 MG tablet Take 1 tablet (10 mg total) by mouth daily. (Patient not taking: Reported on 12/12/2020) 90 tablet 3   esomeprazole (NEXIUM) 20 MG capsule Take 20 mg by mouth daily at 12 noon. (Patient not taking: Reported on 12/12/2020)     fluconazole (DIFLUCAN) 200 MG tablet Take 1 tablet (200 mg total) by mouth daily. For 1 week (Patient not taking: Reported on 12/12/2020) 7 tablet 1   hydrochlorothiazide (HYDRODIURIL) 25 MG tablet TAKE 1 TABLET(25 MG) BY MOUTH DAILY. 90 tablet 0   levothyroxine (SYNTHROID) 50 MCG tablet TAKE 1 TABLET(50 MCG) BY MOUTH DAILY BEFORE BREAKFAST. 90 tablet 0   lisinopril (ZESTRIL) 10 MG tablet Take 1 tablet (10 mg total) by mouth daily. (Patient not taking: No sig reported) 90 tablet 1   methocarbamol (ROBAXIN) 500 MG tablet Take 1 tablet (500 mg total) by mouth 4 (four) times daily. (Patient not taking: No sig reported) 30  tablet 0   Multiple Vitamins-Minerals (EMERGEN-C IMMUNE PO) Take 3 tablets by mouth daily at 3 pm. (Patient not taking: Reported on 12/12/2020)     polyethylene glycol-electrolytes (GAVILYTE-N WITH FLAVOR PACK) 420 g solution Drink one 8 oz glass every 20 mins until entire container is finished starting at 5:00pm on 06/19/20 (Patient not taking: Reported on 12/12/2020) 4000 mL 0   sucralfate (CARAFATE) 1 GM/10ML suspension TAKE 10 MLS BY MOUTH FOUR TIMES DAILY(WITH MEALS AND AT BEDTIME) AS NEEDED ONLY (Patient not taking: No sig reported) 420 mL 0   No current facility-administered medications for this visit.    No Known Allergies  Family History  Problem Relation Age of Onset   Hashimoto's thyroiditis Mother    Alzheimer's disease Mother    Gallbladder disease Father    Arthritis Sister    Heart disease Brother    Diabetes Brother    Arthritis Brother    Arthritis Brother    Drug abuse Daughter     Social History   Socioeconomic History   Marital status: Married    Spouse name: Not on file   Number of children: 3   Years of education: Not on file   Highest education level: Not on file  Occupational History   Not on file  Tobacco Use   Smoking status: Every Day  Packs/day: 1.00    Years: 50.00    Pack years: 50.00    Types: Cigarettes   Smokeless tobacco: Never  Vaping Use   Vaping Use: Never used  Substance and Sexual Activity   Alcohol use: Yes    Alcohol/week: 0.0 standard drinks    Comment: champagne   Drug use: No   Sexual activity: Not on file  Other Topics Concern   Not on file  Social History Narrative   Not on file   Social Determinants of Health   Financial Resource Strain: Not on file  Food Insecurity: Not on file  Transportation Needs: Not on file  Physical Activity: Not on file  Stress: Not on file  Social Connections: Not on file  Intimate Partner Violence: Not on file     Constitutional: Denies fever, malaise, fatigue, headache or abrupt  weight changes.  HEENT: Pt reports left ear fulness. Denies eye pain, eye redness, ear pain, ringing in the ears, wax buildup, runny nose, nasal congestion, bloody nose, or sore throat. Respiratory: Denies difficulty breathing, shortness of breath, cough or sputum production.   Cardiovascular: Denies chest pain, chest tightness, palpitations or swelling in the hands or feet.  Skin: Pt reports knot in front of left ear. Denies redness, rashes, lesions or ulcercations.    No other specific complaints in a complete review of systems (except as listed in HPI above).  Objective:   Physical Exam  BP (!) 142/82 (BP Location: Right Arm, Patient Position: Sitting, Cuff Size: Large)   Pulse 82   Temp (!) 97.5 F (36.4 C) (Temporal)   Resp 18   Ht 5\' 5"  (1.651 m)   Wt 184 lb (83.5 kg)   LMP  (LMP Unknown)   SpO2 99%   BMI 30.62 kg/m   Wt Readings from Last 3 Encounters:  12/12/20 186 lb 3.2 oz (84.5 kg)  08/14/20 184 lb 6.4 oz (83.6 kg)  06/20/20 190 lb (86.2 kg)    General: Appears her stated age, obese,  in NAD. Skin: Warm, dry and intact. No rashes noted. HEENT: Head: normal shape and size; Eyes: sclera white and EOMs intact; Ears: Tm's gray and intact, normal light reflex, + effusion on the left;  Throat/Mouth: Teeth present, mucosa pink and moist, + PND, no exudate, lesions or ulcerations noted.  Neck:  NLeft preauricular lymph node enlargement noted. Cardiovascular: Normal rate and rhythm.  Pulmonary/Chest: Normal effort and positive vesicular breath sounds. No respiratory distress. No wheezes, rales or ronchi noted.  Neurological: Alert and oriented.   BMET    Component Value Date/Time   NA 140 05/13/2020 0928   K 3.8 05/13/2020 0928   CL 101 05/13/2020 0928   CO2 26 05/13/2020 0928   GLUCOSE 100 (H) 05/13/2020 0928   BUN 17 05/13/2020 0928   CREATININE 0.76 05/13/2020 0928   CALCIUM 9.6 05/13/2020 0928   GFRNONAA 86 05/13/2020 0928   GFRAA 100 05/13/2020 0928     Lipid Panel     Component Value Date/Time   CHOL 225 (H) 05/13/2020 0928   TRIG 108 05/13/2020 0928   HDL 84 05/13/2020 0928   CHOLHDL 2.7 05/13/2020 0928   LDLCALC 119 (H) 05/13/2020 0928    CBC    Component Value Date/Time   WBC 8.4 05/13/2020 0928   RBC 4.83 05/13/2020 0928   HGB 16.1 (H) 05/13/2020 0928   HCT 46.5 (H) 05/13/2020 0928   PLT 264 05/13/2020 0928   MCV 96.3 05/13/2020 0928   MCH  33.3 (H) 05/13/2020 0928   MCHC 34.6 05/13/2020 0928   RDW 12.2 05/13/2020 0928   LYMPHSABS 1,260 05/13/2020 0928   EOSABS 67 05/13/2020 0928   BASOSABS 17 05/13/2020 0928    Hgb A1C No results found for: HGBA1C         Assessment & Plan:  Preauricular Lymphadenopathy and ETD Left:  I recommend steroid shot but she declines this RX for Prednisone 10 mg x 3 days Continue Flonase  Return precautions discussed  Webb Silversmith, NP This visit occurred during the SARS-CoV-2 public health emergency.  Safety protocols were in place, including screening questions prior to the visit, additional usage of staff PPE, and extensive cleaning of exam room while observing appropriate contact time as indicated for disinfecting solutions.

## 2020-12-26 NOTE — Patient Instructions (Signed)
Eustachian Tube Dysfunction  Eustachian tube dysfunction refers to a condition in which a blockage develops in the narrow passage that connects the middle ear to the back of the nose (eustachian tube). The eustachian tube regulates air pressure in the middle ear by letting air move between the ear and nose. It also helps to drain fluid from the middle earspace. Eustachian tube dysfunction can affect one or both ears. When the eustachian tube does not function properly, air pressure, fluid, or both can build up inthe middle ear. What are the causes? This condition occurs when the eustachian tube becomes blocked or cannot open normally. Common causes of this condition include: Ear infections. Colds and other infections that affect the nose, mouth, and throat (upper respiratory tract). Allergies. Irritation from cigarette smoke. Irritation from stomach acid coming up into the esophagus (gastroesophageal reflux). The esophagus is the tube that carries food from the mouth to the stomach. Sudden changes in air pressure, such as from descending in an airplane or scuba diving. Abnormal growths in the nose or throat, such as: Growths that line the nose (nasal polyps). Abnormal growth of cells (tumors). Enlarged tissue at the back of the throat (adenoids). What increases the risk? You are more likely to develop this condition if: You smoke. You are overweight. You are a child who has: Certain birth defects of the mouth, such as cleft palate. Large tonsils or adenoids. What are the signs or symptoms? Common symptoms of this condition include: A feeling of fullness in the ear. Ear pain. Clicking or popping noises in the ear. Ringing in the ear. Hearing loss. Loss of balance. Dizziness. Symptoms may get worse when the air pressure around you changes, such as whenyou travel to an area of high elevation, fly on an airplane, or go scuba diving. How is this diagnosed? This condition may be diagnosed  based on: Your symptoms. A physical exam of your ears, nose, and throat. Tests, such as those that measure: The movement of your eardrum (tympanogram). Your hearing (audiometry). How is this treated? Treatment depends on the cause and severity of your condition. In mild cases, you may relieve your symptoms by moving air into your ears. This is called "popping the ears." In more severe cases, or if you have symptoms of fluid in your ears, treatment may include: Medicines to relieve congestion (decongestants). Medicines that treat allergies (antihistamines). Nasal sprays or ear drops that contain medicines that reduce swelling (steroids). A procedure to drain the fluid in your eardrum (myringotomy). In this procedure, a small tube is placed in the eardrum to: Drain the fluid. Restore the air in the middle ear space. A procedure to insert a balloon device through the nose to inflate the opening of the eustachian tube (balloon dilation). Follow these instructions at home: Lifestyle Do not do any of the following until your health care provider approves: Travel to high altitudes. Fly in airplanes. Work in a pressurized cabin or room. Scuba dive. Do not use any products that contain nicotine or tobacco, such as cigarettes and e-cigarettes. If you need help quitting, ask your health care provider. Keep your ears dry. Wear fitted earplugs during showering and bathing. Dry your ears completely after. General instructions Take over-the-counter and prescription medicines only as told by your health care provider. Use techniques to help pop your ears as recommended by your health care provider. These may include: Chewing gum. Yawning. Frequent, forceful swallowing. Closing your mouth, holding your nose closed, and gently blowing as if you   are trying to blow air out of your nose. Keep all follow-up visits as told by your health care provider. This is important. Contact a health care provider  if: Your symptoms do not go away after treatment. Your symptoms come back after treatment. You are unable to pop your ears. You have: A fever. Pain in your ear. Pain in your head or neck. Fluid draining from your ear. Your hearing suddenly changes. You become very dizzy. You lose your balance. Summary Eustachian tube dysfunction refers to a condition in which a blockage develops in the eustachian tube. It can be caused by ear infections, allergies, inhaled irritants, or abnormal growths in the nose or throat. Symptoms include ear pain, hearing loss, or ringing in the ears. Mild cases are treated with maneuvers to unblock the ears, such as yawning or ear popping. Severe cases are treated with medicines. Surgery may also be done (rare). This information is not intended to replace advice given to you by your health care provider. Make sure you discuss any questions you have with your healthcare provider. Document Revised: 10/12/2017 Document Reviewed: 10/12/2017 Elsevier Patient Education  2022 Elsevier Inc.  

## 2021-03-04 ENCOUNTER — Other Ambulatory Visit: Payer: Self-pay | Admitting: Internal Medicine

## 2021-03-04 DIAGNOSIS — E039 Hypothyroidism, unspecified: Secondary | ICD-10-CM

## 2021-03-04 DIAGNOSIS — I10 Essential (primary) hypertension: Secondary | ICD-10-CM

## 2021-03-04 NOTE — Telephone Encounter (Signed)
Requested medication (s) are due for refill today: yes to both meds  Requested medication (s) are on the active medication list: yes  Last refill:  Both refilled: 12/09/20  Future visit scheduled: no  Notes to clinic:  levothyroxine with overdue lab work                           HCTZ: last office visit that addresses the med 04/11/20                       Requested Prescriptions  Pending Prescriptions Disp Refills   levothyroxine (SYNTHROID) 50 MCG tablet [Pharmacy Med Name: LEVOTHYROXINE 0.'05MG'$  (50MCG) TAB] 90 tablet 0    Sig: TAKE 1 TABLET(50 MCG) BY MOUTH DAILY BEFORE AND BREAKFAST     Endocrinology:  Hypothyroid Agents Failed - 03/04/2021 10:15 AM      Failed - TSH needs to be rechecked within 3 months after an abnormal result. Refill until TSH is due.      Passed - TSH in normal range and within 360 days    TSH  Date Value Ref Range Status  03/31/2019 0.89 0.40 - 4.50 mIU/L Final          Passed - Valid encounter within last 12 months    Recent Outpatient Visits           2 months ago ETD (Eustachian tube dysfunction), left   [facility] [place], [place] W, NP   2 months ago Seasonal allergic rhinitis due to other allergic trigger   [facility] [place], Coralie Keens, NP   6 months ago Acute otitis externa of right ear, unspecified type   [facility], Devonne Doughty, DO   10 months ago Essential hypertension   [facility], Lupita Raider, FNP   1 year ago Epigastric pain   [place], Devonne Doughty, DO       Future Appointments             In 4 weeks Vanga, Tally Due, MD [place]   In 3 months Ralene Bathe, MD [place]             hydrochlorothiazide (HYDRODIURIL) 25 MG tablet [Pharmacy Med Name: HYDROCHLOROTHIAZIDE '25MG'$  TABLETS] 90 tablet 0    Sig: TAKE 1 TABLET(25 MG) BY MOUTH DAILY     Cardiovascular: Diuretics - Thiazide Failed  - 03/04/2021 10:15 AM      Failed - Last BP in normal range    BP Readings from Last 1 Encounters:  12/26/20 (!) 142/82          Passed - Ca in normal range and within 360 days    Calcium  Date Value Ref Range Status  05/13/2020 9.6 8.6 - 10.4 mg/dL Final          Passed - Cr in normal range and within 360 days    Creat  Date Value Ref Range Status  05/13/2020 0.76 0.50 - 1.05 mg/dL Final    Comment:    For patients >1 years of age, the reference limit for Creatinine is approximately 13% higher for people identified as African-American. .           Passed - K in normal range and within 360 days    Potassium  Date Value Ref Range Status  05/13/2020 3.8 3.5 - 5.3 mmol/L Final  Passed - Na in normal range and within 360 days    Sodium  Date Value Ref Range Status  05/13/2020 140 135 - 146 mmol/L Final          Passed - Valid encounter within last 6 months    Recent Outpatient Visits           2 months ago ETD (Eustachian tube dysfunction), left   [facility] [place], [place] W, NP   2 months ago Seasonal allergic rhinitis due to other allergic trigger   [facility] [place], Coralie Keens, NP   6 months ago Acute otitis externa of right ear, unspecified type   [facility] O[place] Hauser, DO   10 months ago Essential hypertension   [place], FNP   1 year ago Epigastric pain   [facility] Olin Hauser, DO       Future Appointments             In 4 weeks Vanga, Tally Due, MD [place]   In 3 months Ralene Bathe, MD [place]

## 2021-04-02 ENCOUNTER — Ambulatory Visit: Payer: BC Managed Care – PPO | Admitting: Gastroenterology

## 2021-04-02 ENCOUNTER — Encounter: Payer: Self-pay | Admitting: Gastroenterology

## 2021-04-02 ENCOUNTER — Other Ambulatory Visit: Payer: Self-pay

## 2021-04-02 VITALS — BP 177/102 | HR 102 | Temp 97.8°F | Ht 65.0 in | Wt 187.0 lb

## 2021-04-02 DIAGNOSIS — K641 Second degree hemorrhoids: Secondary | ICD-10-CM

## 2021-04-02 NOTE — Progress Notes (Signed)

## 2021-04-02 NOTE — Progress Notes (Signed)
Cephas Darby, MD [address]  [place]  [place], [place] [address]  Main: [phone]  Fax: [phone]    Gastroenterology Consultation  Referring Provider:     Verl Bangs, FNP Primary Care Physician:  Jearld Fenton, NP Primary Gastroenterologist:  Dr. Lucilla Lame Reason for Consultation:     Symptomatic hemorrhoids        HPI:   Tammy Mccall is a 60 y.o. female referred by Dr. Garnette Gunner, Coral[place] Keens, NP  for consultation & management of symptomatic hemorrhoids.  Patient reports that she has been suffering from hemorrhoidal symptoms including rectal pressure, swelling, bright red blood per rectum on wiping and sometimes in the toilet bowel, pain, burning, itching, prolapse.  This has been ongoing for several years.  She reports that her bowel movements are regular, denies any constipation or diarrhea, denies significant straining, does not spend more than 10 minutes in the toilet.  She does smoke 1 pack/day for more than 40 years. She is referred here to discuss about hemorrhoid ligation  NSAIDs: None  Antiplts/Anticoagulants/Anti thrombotics: None  GI Procedures:  EGD and colonoscopy 06/20/2020  - Normal esophagus. - A Nissen fundoplication was found. The wrap appears intact. - Gastritis. Biopsied. - Normal examined duodenum.  - Two 4 to 6 mm polyps in the sigmoid colon, removed with a cold snare. Resected and retrieved. - One 2 mm polyp in the sigmoid colon, removed with a cold biopsy forceps. Resected and retrieved. - One 3 mm polyp in the transverse colon, removed with a cold snare. Resected and retrieved. - Non-bleeding internal hemorrhoids.  DIAGNOSIS:  A. STOMACH, ANTRUM; COLD BIOPSY:  - ANTRAL MUCOSA WITH REACTIVE GASTRITIS.  - NEGATIVE FOR H. PYLORI, DYSPLASIA, AND MALIGNANCY.   B.  COLON POLYP X2, SIGMOID; COLD SNARE:  - HYPERPLASTIC POLYP (3).  - NEGATIVE FOR DYSPLASIA AND MALIGNANCY.   C.  COLON POLYP, TRANSVERSE; COLD SNARE:  -  HYPERPLASTIC POLYP.  - NEGATIVE FOR DYSPLASIA AND MALIGNANCY.   D.  COLON POLYP, SIGMOID; COLD BIOPSY:  - TUBULAR ADENOMA.  - NEGATIVE FOR HIGH-GRADE DYSPLASIA AND MALIGNANCY.   Past Medical History:  Diagnosis Date   Colon polyps    GERD (gastroesophageal reflux disease)    tx maalox qhs   Hypertension    Hypothyroidism    Seasonal allergies    SVD (spontaneous vaginal delivery)    x 1   Thyroid disease     Past Surgical History:  Procedure Laterality Date   CESAREAN SECTION  1990's   x 1 Twins   CHOLECYSTECTOMY  2004   COLONOSCOPY  08/2013   COLONOSCOPY WITH PROPOFOL N/A 06/20/2020   Procedure: COLONOSCOPY WITH PROPOFOL;  Surgeon: Lucilla Lame, MD;  Location: [facility] END[facility];  Service: Endoscopy;  Laterality: N/A;   ESOPHAGOGASTRODUODENOSCOPY (EGD) WITH PROPOFOL N/A 06/20/2020   Procedure: ESOPHAGOGASTRODUODENOSCOPY (EGD) WITH PROPOFOL;  Surgeon: Lucilla Lame, MD;  Location: [facility] ENDOSCOPY;  Service: Endoscopy;  Laterality: N/A;   MIDDLE EAR SURGERY     drum rupture   MULTIPLE TOOTH EXTRACTIONS     all teeth - dentures   NECK SURGERY     NECK SURGERY     stomach surgery     STOMACH SURGERY     THORACIC DISCECTOMY Right 12/02/2018   Procedure: Microdiscectomy - T12-L1 - L2-L3 - right;  Surgeon: Kary Kos, MD;  Location: [facility];  Service: Neurosurgery;  Laterality: Right;   TONSILLECTOMY     TUBAL LIGATION  Current Outpatient Medications:    albuterol (VENTOLIN HFA) 108 (90 Base) MCG/ACT inhaler, albuterol sulfate HFA 90 mcg/actuation aerosol inhaler  INHALE 2 PUFFS BY MOUTH EVERY 4 TO 6 HOURS AS NEEDED, Disp: , Rfl:    hydrochlorothiazide (HYDRODIURIL) 25 MG tablet, TAKE 1 TABLET(25 MG) BY MOUTH DAILY, Disp: 90 tablet, Rfl: 0   levothyroxine (SYNTHROID) 50 MCG tablet, TAKE 1 TABLET(50 MCG) BY MOUTH DAILY BEFORE AND BREAKFAST, Disp: 90 tablet, Rfl: 0    Family History  Problem Relation Age of Onset   Hashimoto's thyroiditis Mother    Alzheimer's disease Mother     Gallbladder disease Father    Arthritis Sister    Heart disease Brother    Diabetes Brother    Arthritis Brother    Arthritis Brother    Drug abuse Daughter      Social History   Tobacco Use   Smoking status: Every Day    Packs/day: 1.00    Years: 50.00    Pack years: 50.00    Types: Cigarettes   Smokeless tobacco: Never  Vaping Use   Vaping Use: Never used  Substance Use Topics   Alcohol use: Yes    Alcohol/week: 0.0 standard drinks    Comment: champagne   Drug use: No    Allergies as of 04/02/2021   (No Known Allergies)    Review of Systems:    All systems reviewed and negative except where noted in HPI.   Physical Exam:  BP (!) 177/102 (BP Location: Left Arm, Patient Position: Sitting, Cuff Size: Normal)   Pulse (!) 102   Temp 97.8 F (36.6 C) (Oral)   Ht 5\' 5"  (1.651 m)   Wt 187 lb (84.8 kg)   LMP  (LMP Unknown)   BMI 31.12 kg/m  No LMP recorded (lmp unknown). Patient is postmenopausal.  General:   Alert,  Well-developed, well-nourished, pleasant and cooperative in NAD Head:  Normocephalic and atraumatic. Eyes:  Sclera clear, no icterus.   Conjunctiva pink. Ears:  Normal auditory acuity. Nose:  No deformity, discharge, or lesions. Mouth:  No deformity or lesions,oropharynx pink & moist. Neck:  Supple; no masses or thyromegaly. Lungs:  Respirations even and unlabored.  Clear throughout to auscultation.   No wheezes, crackles, or rhonchi. No acute distress. Heart:  Regular rate and rhythm; no murmurs, clicks, rubs, or gallops. Abdomen:  Normal bowel sounds. Soft, non-tender and non-distended without masses, hepatosplenomegaly or hernias noted.  No guarding or rebound tenderness.   Rectal: Small perianal skin tags, mild tenderness on digital rectal exam from inflamed hemorrhoids, large palpable hemorrhoids Msk:  Symmetrical without gross deformities. Good, equal movement & strength bilaterally. Pulses:  Normal pulses noted. Extremities:  No clubbing or  edema.  No cyanosis. Neurologic:  Alert and oriented x3;  grossly normal neurologically. Skin:  Intact without significant lesions or rashes. No jaundice. Psych:  Alert and cooperative. Normal mood and affect.  Imaging Studies: Reviewed  Assessment and Plan:   Tammy Mccall is a 60 y.o. pleasant Caucasian female with history of chronic tobacco use, status post Nissen's fundoplication is seen in consultation for grade 2 symptomatic hemorrhoids  Grade 2 symptomatic hemorrhoids Discussed about hemorrhoid ligation, procedure, risks and benefits Patient is willing to undergo banding, consent obtained Perform hemorrhoid ligation today   Follow up in 2 weeks   Cephas Darby, MD

## 2021-04-02 NOTE — Patient Instructions (Signed)
High-Fiber Eating Plan °Fiber, also called dietary fiber, is a type of carbohydrate. It is found foods such as fruits, vegetables, whole grains, and beans. A high-fiber diet can have many health benefits. Your health care provider may recommend a high-fiber diet to help: °Prevent constipation. Fiber can make your bowel movements more regular. °Lower your cholesterol. °Relieve the following conditions: °Inflammation of veins in the anus (hemorrhoids). °Inflammation of specific areas of the digestive tract (uncomplicated diverticulosis). °A problem of the large intestine, also called the colon, that sometimes causes pain and diarrhea (irritable bowel syndrome, or IBS). °Prevent overeating as part of a weight-loss plan. °Prevent heart disease, type 2 diabetes, and certain cancers. °What are tips for following this plan? °Reading food labels ° °Check the nutrition facts label on food products for the amount of dietary fiber. Choose foods that have 5 grams of fiber or more per serving. °The goals for recommended daily fiber intake include: °Men (age 50 or younger): 34-38 g. °Men (over age 50): 28-34 g. °Women (age 50 or younger): 25-28 g. °Women (over age 50): 22-25 g. °Your daily fiber goal is _____________ g. °Shopping °Choose whole fruits and vegetables instead of processed forms, such as apple juice or applesauce. °Choose a wide variety of high-fiber foods such as avocados, lentils, oats, and kidney beans. °Read the nutrition facts label of the foods you choose. Be aware of foods with added fiber. These foods often have high sugar and sodium amounts per serving. °Cooking °Use whole-grain flour for baking and cooking. °Cook with brown rice instead of white rice. °Meal planning °Start the day with a breakfast that is high in fiber, such as a cereal that contains 5 g of fiber or more per serving. °Eat breads and cereals that are made with whole-grain flour instead of refined flour or white flour. °Eat brown rice, bulgur  wheat, or millet instead of white rice. °Use beans in place of meat in soups, salads, and pasta dishes. °Be sure that half of the grains you eat each day are whole grains. °General information °You can get the recommended daily intake of dietary fiber by: °Eating a variety of fruits, vegetables, grains, nuts, and beans. °Taking a fiber supplement if you are not able to take in enough fiber in your diet. It is better to get fiber through food than from a supplement. °Gradually increase how much fiber you consume. If you increase your intake of dietary fiber too quickly, you may have bloating, cramping, or gas. °Drink plenty of water to help you digest fiber. °Choose high-fiber snacks, such as berries, raw vegetables, nuts, and popcorn. °What foods should I eat? °Fruits °Berries. Pears. Apples. Oranges. Avocado. Prunes and raisins. Dried figs. °Vegetables °Sweet potatoes. Spinach. Kale. Artichokes. Cabbage. Broccoli. Cauliflower. Green peas. Carrots. Squash. °Grains °Whole-grain breads. Multigrain cereal. Oats and oatmeal. Brown rice. Barley. Bulgur wheat. Millet. Quinoa. Bran muffins. Popcorn. Rye wafer crackers. °Meats and other proteins °Navy beans, kidney beans, and pinto beans. Soybeans. Split peas. Lentils. Nuts and seeds. °Dairy °Fiber-fortified yogurt. °Beverages °Fiber-fortified soy milk. Fiber-fortified orange juice. °Other foods °Fiber bars. °The items listed above may not be a complete list of recommended foods and beverages. Contact a dietitian for more information. °What foods should I avoid? °Fruits °Fruit juice. Cooked, strained fruit. °Vegetables °Fried potatoes. Canned vegetables. Well-cooked vegetables. °Grains °White bread. Pasta made with refined flour. White rice. °Meats and other proteins °Fatty cuts of meat. Fried chicken or fried fish. °Dairy °Milk. Yogurt. Cream cheese. Sour cream. °Fats and   oils °Butters. °Beverages °Soft drinks. °Other foods °Cakes and pastries. °The items listed above may  not be a complete list of foods and beverages to avoid. Talk with your dietitian about what choices are best for you. °Summary °Fiber is a type of carbohydrate. It is found in foods such as fruits, vegetables, whole grains, and beans. °A high-fiber diet has many benefits. It can help to prevent constipation, lower blood cholesterol, aid weight loss, and reduce your risk of heart disease, diabetes, and certain cancers. °Increase your intake of fiber gradually. Increasing fiber too quickly may cause cramping, bloating, and gas. Drink plenty of water while you increase the amount of fiber you consume. °The best sources of fiber include whole fruits and vegetables, whole grains, nuts, seeds, and beans. °This information is not intended to replace advice given to you by your health care provider. Make sure you discuss any questions you have with your health care provider. °Document Revised: 10/26/2019 Document Reviewed: 10/26/2019 °Elsevier Patient Education © 2022 Else[employer] Inc. ° °

## 2021-04-03 ENCOUNTER — Telehealth: Payer: Self-pay | Admitting: Gastroenterology

## 2021-04-03 MED ORDER — HYDROCORTISONE (PERIANAL) 2.5 % EX CREA: 1.0000 "application " | TOPICAL_CREAM | Freq: Two times a day (BID) | CUTANEOUS | 0 refills | Status: DC

## 2021-04-03 NOTE — Telephone Encounter (Signed)
Patient verbalized understanding of instructions. Sent Anusol cream to the pharmacy

## 2021-04-03 NOTE — Telephone Encounter (Signed)
Patient states she woke up this morning with diarrhea. She states she only went twice and has not had diarrhea since. She states that she did not have rectal bleeding but the hemorrhoid is sticking out and is burning. She wants to know what you recommend. Has not done or took anything for the burning

## 2021-04-03 NOTE — Telephone Encounter (Signed)
If it is only burning, most likely not from banding unless she starts developing pain, cramping. Have her try the Anusol cream per rectum twice daily until I see her in 2 weeks  RV

## 2021-04-03 NOTE — Telephone Encounter (Signed)
Pt. Was seen yesterday. She said she has diarrhea and her behind is hurting.. Requesting a call back.

## 2021-04-03 NOTE — Addendum Note (Signed)
Addended by: Ulyess Blossom L on: 04/03/2021 10:05 AM   Modules accepted: Orders

## 2021-04-16 ENCOUNTER — Ambulatory Visit: Payer: BC Managed Care – PPO | Admitting: Gastroenterology

## 2021-05-05 ENCOUNTER — Ambulatory Visit: Payer: Self-pay | Admitting: *Deleted

## 2021-05-05 NOTE — Telephone Encounter (Signed)
Three attempts to reach pt, unable to do so. Left Vm to call office to discuss with a nurse. Unable to reach after 3 attempts by PEC NT. Routing to provider for resolution per protocol.

## 2021-05-05 NOTE — Telephone Encounter (Addendum)
Pt called stating that she is having R ear pain. No appt until tomorrow and pt is requesting to have a call back. Please advise.  Attempted to reach pt. Left VM to CB.   Second attempt to reach pt, left VM to call back. Inadvertently deleted from triage que. Will continue attempts to reach and triage.

## 2021-05-15 ENCOUNTER — Ambulatory Visit: Payer: Self-pay | Admitting: *Deleted

## 2021-05-15 NOTE — Telephone Encounter (Signed)
Patient c/o right ear pain, feels plugged with some pressure below the ear. Mild pain with occasional drainage and popping for over one month. Seen at [facility] last and prescribed Augmentin 850 mg, today is day 9 of taking and no improvement. Unsure of any fever but no other symptoms. Advil helps for a short time.  Has seen Baity the last few visits including 12/26/20 for ear pain. She does not want to see Baity and would like to be seen by Dr. Parks Ranger and or requesting ENT referrral. Routing to office for review of Dr. Raliegh Ip appointment/referral.       Reason for Disposition  Ear congestion present > 48 hours  Answer Assessment - Initial Assessment Questions 1. LOCATION: "Which ear is involved?"       right 2. SENSATION: "Describe how the ear feels." (e.g. stuffy, full, plugged)."      plugged 3. ONSET:  "When did the ear symptoms start?"       Over one month ago 4. PAIN: "Do you also have an earache?" If Yes, ask: "How bad is it?" (Scale 1-10; or mild, moderate, severe)     mild 5. CAUSE: "What do you think is causing the ear congestion?"     Always had ear troubles 6. URI: "Do you have a runny nose or cough?"      no 7. NASAL ALLERGIES: "Are there symptoms of hay fever, such as sneezing or a clear nasal discharge?"     no 8. PREGNANCY: "Is there any chance you are pregnant?" "When was your last menstrual period?"     na  Protocols used: Ear - Congestion-A-AH

## 2021-05-15 NOTE — Telephone Encounter (Signed)
I am reviewing messages right now but I am out of office and do not return until Monday 11/14.  I have not seen this patient often, last was for ear issue back in 08/2020.  I agree with Webb Silversmith, FNP last office visit in June 2022 for this problem, and she was treated with steroid course for the ear/pressure pain and eustachian tube dysfunction.  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  This is not a straightforward case of ear pain from what I can tell on record review.  I tried to refer her back to [facility] in Feb 2022, however she had an outstanding balance and could not return.  At this time, there is not much else that I believe we can treat her with in office. She will need an ENT consultation.  Please inquire if she has paid balance at [facility] and if she can return to them - or if she has other preferences.  Other ENT would be in [place]. We have had some patients have good results with [facility] - ([place]) in [place].  Other options would include [facility] but not local.  Will route note back to clinical pool and [place]. Either I can place referral or Rollene Fare can place it to get patient to the ENT of her choice.  Thanks  Nobie Putnam, [facility] 05/15/2021, 3:40 PM

## 2021-05-16 ENCOUNTER — Telehealth: Payer: Self-pay

## 2021-05-16 NOTE — Telephone Encounter (Signed)
Copied from [place] [id number]. Topic: Appointment Scheduling - Scheduling Inquiry for Clinic >> May 15, 2021  9:28 AM Oneta Rack wrote: Patient would like to transfer her care to Dr. Parks Ranger, patient states she would prefer to see a MD.

## 2021-05-28 ENCOUNTER — Telehealth: Payer: Self-pay | Admitting: Family Medicine

## 2021-05-28 ENCOUNTER — Telehealth: Payer: Self-pay | Admitting: Internal Medicine

## 2021-05-28 DIAGNOSIS — I889 Nonspecific lymphadenitis, unspecified: Secondary | ICD-10-CM

## 2021-05-28 DIAGNOSIS — H6983 Other specified disorders of Eustachian tube, bilateral: Secondary | ICD-10-CM

## 2021-05-28 NOTE — Telephone Encounter (Signed)
Referral Request - Has patient seen PCP for this complaint? yes *If NO, is insurance requiring patient see PCP for this issue before PCP can refer them? Referral for which specialty:ear specialist Preferred provider/office: patient asking for referral in [place] Reason for referral: ear drum problem

## 2021-05-28 NOTE — Telephone Encounter (Signed)
Pt wants to be referred to the specialty ENT in [place], says its the only one in [place].

## 2021-05-29 ENCOUNTER — Other Ambulatory Visit: Payer: Self-pay | Admitting: Internal Medicine

## 2021-05-29 DIAGNOSIS — I10 Essential (primary) hypertension: Secondary | ICD-10-CM

## 2021-05-29 DIAGNOSIS — E039 Hypothyroidism, unspecified: Secondary | ICD-10-CM

## 2021-05-30 NOTE — Telephone Encounter (Signed)
Requested Prescriptions  Pending Prescriptions Disp Refills  . levothyroxine (SYNTHROID) 50 MCG tablet [Pharmacy Med Name: LEVOTHYROXINE 0.05MG  (50MCG) TAB] 90 tablet 0    Sig: TAKE 1 TABLET(50 MCG) BY MOUTH DAILY BEFORE BREAKFAST     Endocrinology:  Hypothyroid Agents Failed - 05/29/2021  7:11 AM      Failed - TSH needs to be rechecked within 3 months after an abnormal result. Refill until TSH is due.      Failed - TSH in normal range and within 360 days    TSH  Date Value Ref Range Status  03/31/2019 0.89 0.40 - 4.50 mIU/L Final         Passed - Valid encounter within last 12 months    Recent Outpatient Visits          5 months ago ETD (Eustachian tube dysfunction), left   [facility] [place], [place] W, NP   5 months ago Seasonal allergic rhinitis due to other allergic trigger   [facility] [place], Coralie Keens, NP   9 months ago Acute otitis externa of right ear, unspecified type   [facility] Olin Hauser, DO   1 year ago Essential hypertension   [place], FNP   1 year ago Epigastric pain   [facility] Olin Hauser, DO      Future Appointments            In 6 days Ralene Bathe, MD [place]           . hydrochlorothiazide (HYDRODIURIL) 25 MG tablet [Pharmacy Med Name: HYDROCHLOROTHIAZIDE 25MG  TABLETS] 90 tablet 0    Sig: TAKE 1 TABLET(25 MG) BY MOUTH DAILY     Cardiovascular: Diuretics - Thiazide Failed - 05/29/2021  7:11 AM      Failed - Ca in normal range and within 360 days    Calcium  Date Value Ref Range Status  05/13/2020 9.6 8.6 - 10.4 mg/dL Final         Failed - Cr in normal range and within 360 days    Creat  Date Value Ref Range Status  05/13/2020 0.76 0.50 - 1.05 mg/dL Final    Comment:    For patients >16 years of age, the reference limit for Creatinine is approximately 13% higher for people identified as  African-American. .          Failed - K in normal range and within 360 days    Potassium  Date Value Ref Range Status  05/13/2020 3.8 3.5 - 5.3 mmol/L Final         Failed - Na in normal range and within 360 days    Sodium  Date Value Ref Range Status  05/13/2020 140 135 - 146 mmol/L Final         Failed - Last BP in normal range    BP Readings from Last 1 Encounters:  04/02/21 (!) 177/102         Passed - Valid encounter within last 6 months    Recent Outpatient Visits          5 months ago ETD (Eustachian tube dysfunction), left   [facility] [place], [place] W, NP   5 months ago Seasonal allergic rhinitis due to other allergic trigger   [facility] [place], [place], NP   9 months ago Acute otitis externa of right ear, unspecified type  [place], DO   1 year ago Essential hypertension   [place], FNP   1 year ago Epigastric pain   [facility] Olin Hauser, DO      Future Appointments            In 6 days Ralene Bathe, MD [place]

## 2021-06-02 NOTE — Telephone Encounter (Signed)
I have placed referral to ENT. It seems as if patient wants to transfer care to Dr. Parks Ranger, just [facility].

## 2021-06-02 NOTE — Addendum Note (Signed)
Addended by: Jearld Fenton on: 06/02/2021 09:24 AM   Modules accepted: Orders

## 2021-06-05 ENCOUNTER — Ambulatory Visit: Payer: BC Managed Care – PPO | Admitting: Dermatology

## 2021-08-27 ENCOUNTER — Other Ambulatory Visit: Payer: Self-pay | Admitting: Internal Medicine

## 2021-08-27 DIAGNOSIS — E039 Hypothyroidism, unspecified: Secondary | ICD-10-CM

## 2021-08-27 DIAGNOSIS — I10 Essential (primary) hypertension: Secondary | ICD-10-CM

## 2021-08-27 NOTE — Telephone Encounter (Signed)
Courtesy refill. Left message to make an appointment. Requested Prescriptions  Pending Prescriptions Disp Refills   levothyroxine (SYNTHROID) 50 MCG tablet [Pharmacy Med Name: LEVOTHYROXINE 0.05MG  (50MCG) TAB] 30 tablet 0    Sig: TAKE 1 TABLET(50 MCG) BY MOUTH DAILY BEFORE BREAKFAST     Endocrinology:  Hypothyroid Agents Failed - 08/27/2021  7:11 AM      Failed - TSH in normal range and within 360 days    TSH  Date Value Ref Range Status  03/31/2019 0.89 0.40 - 4.50 mIU/L Final         Passed - Valid encounter within last 12 months    Recent Outpatient Visits          8 months ago ETD (Eustachian tube dysfunction), left   [facility] [place], [place] W, NP   8 months ago Seasonal allergic rhinitis due to other allergic trigger   [facility] [place], Coralie Keens, NP   1 year ago Acute otitis externa of right ear, unspecified type   [place], Devonne Doughty, DO   1 year ago Essential hypertension   [place], FNP   2 years ago Epigastric pain   [facility] [place], Devonne Doughty, DO              hydrochlorothiazide (HYDRODIURIL) 25 MG tablet [Pharmacy Med Name: HYDROCHLOROTHIAZIDE 25MG  TABLETS] 30 tablet 0    Sig: TAKE 1 TABLET(25 MG) BY MOUTH DAILY     Cardiovascular: Diuretics - Thiazide Failed - 08/27/2021  7:11 AM      Failed - Cr in normal range and within 180 days    Creat  Date Value Ref Range Status  05/13/2020 0.76 0.50 - 1.05 mg/dL Final    Comment:    For patients >39 years of age, the reference limit for Creatinine is approximately 13% higher for people identified as African-American. .          Failed - K in normal range and within 180 days    Potassium  Date Value Ref Range Status  05/13/2020 3.8 3.5 - 5.3 mmol/L Final         Failed - Na in normal range and within 180 days    Sodium  Date Value Ref Range Status  05/13/2020 140 135 - 146 mmol/L  Final         Failed - Last BP in normal range    BP Readings from Last 1 Encounters:  04/02/21 (!) 177/102         Failed - Valid encounter within last 6 months    Recent Outpatient Visits          8 months ago ETD (Eustachian tube dysfunction), left   [facility] [place], [place] W, NP   8 months ago Seasonal allergic rhinitis due to other allergic trigger   [facility] [place], Coralie Keens, NP   1 year ago Acute otitis externa of right ear, unspecified type   [facility] Olin Hauser, DO   1 year ago Essential hypertension   [place], FNP   2 years ago Epigastric pain   [place], Devonne Doughty, DO

## 2021-09-26 ENCOUNTER — Other Ambulatory Visit: Payer: Self-pay | Admitting: Internal Medicine

## 2021-09-26 DIAGNOSIS — E039 Hypothyroidism, unspecified: Secondary | ICD-10-CM

## 2021-09-26 DIAGNOSIS — I10 Essential (primary) hypertension: Secondary | ICD-10-CM

## 2021-09-29 NOTE — Telephone Encounter (Signed)
Called pt - LMOM requesting return call to make appointment. ?

## 2021-09-29 NOTE — Telephone Encounter (Signed)
Requested medications are due for refill today.  yes ? ?Requested medications are on the active medications list.  yes ? ?Last refill. Both refilled 08/27/2021 #30 0 refills - courtesy refill ? ?Future visit scheduled.   no ? ?Notes to clinic.  Labs are expired. Pt last seen 12/26/2020 - more than 3 months overdue for OV. Called pt and LMOM to return call to make appointment. ? ? ? ?Requested Prescriptions  ?Pending Prescriptions Disp Refills  ? levothyroxine (SYNTHROID) 50 MCG tablet [Pharmacy Med Name: LEVOTHYROXINE 0.'05MG'$  (50MCG) TAB] 30 tablet 0  ?  Sig: TAKE 1 TABLET(50 MCG) BY MOUTH DAILY BEFORE BREAKFAST  ?  ? Endocrinology:  Hypothyroid Agents Failed - 09/26/2021  7:06 AM  ?  ?  Failed - TSH in normal range and within 360 days  ?  TSH  ?Date Value Ref Range Status  ?03/31/2019 0.89 0.40 - 4.50 mIU/L Final  ?  ?  ?  ?  Passed - Valid encounter within last 12 months  ?  Recent Outpatient Visits   ? ?      ? 9 months ago ETD (Eustachian tube dysfunction), left  ? [facility] [place], [place] W, NP  ? 9 months ago Seasonal allergic rhinitis due to other allergic trigger  ? [facility] [place], [place] W, NP  ? 1 year ago Acute otitis externa of right ear, unspecified type  ? [place], DO  ? 1 year ago Essential hypertension  ? [place], FNP  ? 2 years ago Epigastric pain  ? [place], DO  ? ?  ?  ? ?  ?  ?  ? hydrochlorothiazide (HYDRODIURIL) 25 MG tablet [Pharmacy Med Name: HYDROCHLOROTHIAZIDE '25MG'$  TABLETS] 30 tablet 0  ?  Sig: TAKE 1 TABLET(25 MG) BY MOUTH DAILY  ?  ? Cardiovascular: Diuretics - Thiazide Failed - 09/26/2021  7:06 AM  ?  ?  Failed - Cr in normal range and within 180 days  ?  Creat  ?Date Value Ref Range Status  ?05/13/2020 0.76 0.50 - 1.05 mg/dL Final  ?  Comment:  ?  For patients >67 years of age, the reference limit ?for Creatinine is approximately  13% higher for people ?identified as African-American. ?. ?  ?  ?  ?  ?  Failed - K in normal range and within 180 days  ?  Potassium  ?Date Value Ref Range Status  ?05/13/2020 3.8 3.5 - 5.3 mmol/L Final  ?  ?  ?  ?  Failed - Na in normal range and within 180 days  ?  Sodium  ?Date Value Ref Range Status  ?05/13/2020 140 135 - 146 mmol/L Final  ?  ?  ?  ?  Failed - Last BP in normal range  ?  BP Readings from Last 1 Encounters:  ?04/02/21 (!) 177/102  ?  ?  ?  ?  Failed - Valid encounter within last 6 months  ?  Recent Outpatient Visits   ? ?      ? 9 months ago ETD (Eustachian tube dysfunction), left  ? [facility] [place], [place] W, NP  ? 9 months ago Seasonal allergic rhinitis due to other allergic trigger  ? [facility] [place], [place] W, NP  ? 1 year ago Acute otitis externa of right ear, unspecified type  ? [place],  Devonne Doughty, DO  ? 1 year ago Essential hypertension  ? [place], FNP  ? 2 years ago Epigastric pain  ? [place], DO  ? ?  ?  ? ?  ?  ?  ?  ?

## 2021-10-03 ENCOUNTER — Other Ambulatory Visit: Payer: Self-pay | Admitting: Internal Medicine

## 2021-10-03 DIAGNOSIS — I10 Essential (primary) hypertension: Secondary | ICD-10-CM

## 2021-10-03 MED ORDER — HYDROCHLOROTHIAZIDE 25 MG PO TABS: 25.0000 mg | ORAL_TABLET | Freq: Every day | ORAL | 0 refills | Status: DC

## 2021-10-03 NOTE — Telephone Encounter (Signed)
Requested medication (s) are due for refill today - yes ? ?Requested medication (s) are on the active medication list -yes ? ?Future visit scheduled -yes ? ?Last refill: 08/27/21 #30 ? ?Notes to clinic: Request RF: last RF was courtesy- patient has scheduled appointment- sent for review  ? ?Requested Prescriptions  ?Pending Prescriptions Disp Refills  ? hydrochlorothiazide (HYDRODIURIL) 25 MG tablet 30 tablet 0  ?  ? Cardiovascular: Diuretics - Thiazide Failed - 10/03/2021 12:10 PM  ?  ?  Failed - Cr in normal range and within 180 days  ?  Creat  ?Date Value Ref Range Status  ?05/13/2020 0.76 0.50 - 1.05 mg/dL Final  ?  Comment:  ?  For patients >8 years of age, the reference limit ?for Creatinine is approximately 13% higher for people ?identified as African-American. ?. ?  ?  ?  ?  ?  Failed - K in normal range and within 180 days  ?  Potassium  ?Date Value Ref Range Status  ?05/13/2020 3.8 3.5 - 5.3 mmol/L Final  ?  ?  ?  ?  Failed - Na in normal range and within 180 days  ?  Sodium  ?Date Value Ref Range Status  ?05/13/2020 140 135 - 146 mmol/L Final  ?  ?  ?  ?  Failed - Last BP in normal range  ?  BP Readings from Last 1 Encounters:  ?04/02/21 (!) 177/102  ?  ?  ?  ?  Failed - Valid encounter within last 6 months  ?  Recent Outpatient Visits   ? ?      ? 9 months ago ETD (Eustachian tube dysfunction), left  ? [facility] [place], [place] W, NP  ? 9 months ago Seasonal allergic rhinitis due to other allergic trigger  ? [facility] [place], [place] W, NP  ? 1 year ago Acute otitis externa of right ear, unspecified type  ? [place], DO  ? 1 year ago Essential hypertension  ? [place], FNP  ? 2 years ago Epigastric pain  ? [place], DO  ? ?  ?  ?Future Appointments   ? ?        ? In 6 days Parks Ranger, Devonne Doughty, DO [facility] [place]  ? ?  ? ?  ?  ?   ? ? ? ?Requested Prescriptions  ?Pending Prescriptions Disp Refills  ? hydrochlorothiazide (HYDRODIURIL) 25 MG tablet 30 tablet 0  ?  ? Cardiovascular: Diuretics - Thiazide Failed - 10/03/2021 12:10 PM  ?  ?  Failed - Cr in normal range and within 180 days  ?  Creat  ?Date Value Ref Range Status  ?05/13/2020 0.76 0.50 - 1.05 mg/dL Final  ?  Comment:  ?  For patients >38 years of age, the reference limit ?for Creatinine is approximately 13% higher for people ?identified as African-American. ?. ?  ?  ?  ?  ?  Failed - K in normal range and within 180 days  ?  Potassium  ?Date Value Ref Range Status  ?05/13/2020 3.8 3.5 - 5.3 mmol/L Final  ?  ?  ?  ?  Failed - Na in normal range and within 180 days  ?  Sodium  ?Date Value Ref Range Status  ?05/13/2020 140 135 - 146 mmol/L Final  ?  ?  ?  ?  Failed -  Last BP in normal range  ?  BP Readings from Last 1 Encounters:  ?04/02/21 (!) 177/102  ?  ?  ?  ?  Failed - Valid encounter within last 6 months  ?  Recent Outpatient Visits   ? ?      ? 9 months ago ETD (Eustachian tube dysfunction), left  ? [facility] [place], [place] W, NP  ? 9 months ago Seasonal allergic rhinitis due to other allergic trigger  ? [facility] [place], [place] W, NP  ? 1 year ago Acute otitis externa of right ear, unspecified type  ? [place], DO  ? 1 year ago Essential hypertension  ? [place], FNP  ? 2 years ago Epigastric pain  ? [place], DO  ? ?  ?  ?Future Appointments   ? ?        ? In 6 days Parks Ranger, Devonne Doughty, DO [facility] [place]  ? ?  ? ?  ?  ?  ? ? ? ?

## 2021-10-03 NOTE — Telephone Encounter (Signed)
Medication Refill - Medication: hydrochlorothiazide (HYDRODIURIL) 25 MG tablet ? ?Has the patient contacted their pharmacy? No. ?(Pt needed appt for refills, she has appt 4/06, but will be out of medication on Sunday.  ?Can you send enough to get her to appt?   ? ?Preferred Pharmacy (with phone number or street name): [facility] [place] ?Has the patient been seen for an appointment in the last year OR does the patient have an upcoming appointment? Yes.   ? ?Agent: Please be advised that RX refills may take up to 3 business days. We ask that you follow-up with your pharmacy.  ?

## 2021-10-09 ENCOUNTER — Ambulatory Visit: Payer: BC Managed Care – PPO | Admitting: Family Medicine

## 2021-11-11 ENCOUNTER — Other Ambulatory Visit: Payer: Self-pay | Admitting: Internal Medicine

## 2021-11-11 DIAGNOSIS — E039 Hypothyroidism, unspecified: Secondary | ICD-10-CM

## 2021-11-11 NOTE — Telephone Encounter (Signed)
Copied from [place] [id number]. Topic: General - Other ?>> Nov 11, 2021 10:12 AM Tessa Lerner A wrote: ?Reason for CRM: Medication Refill - Medication: levothyroxine (SYNTHROID) 50 MCG tablet [[id number]]  ? ?Has the patient contacted their pharmacy? Yes.  The patient has been directed to contact their PCP ?(Agent: If no, request that the patient contact the pharmacy for the refill. If patient does not wish to contact the pharmacy document the reason why and proceed with request.) ?(Agent: If yes, when and what did the pharmacy advise?) ? ?Preferred Pharmacy (with phone number or street name): [facility] [place], [place] ?[place] [address] ?Phone: [phone] Fax: [phone] ?Hours: Not open 24 hours ? ?Has the patient been seen for an appointment in the last year OR does the patient have an upcoming appointment? Yes.   ? ?Agent: Please be advised that RX refills may take up to 3 business days. We ask that you follow-up with your pharmacy. ?

## 2021-11-12 NOTE — Telephone Encounter (Signed)
Requested medication (s) are due for refill today: yes ? ?Requested medication (s) are on the active medication list: yes   ? ?Last refill: 08/27/21  #30  0 refills ? ?Future visit scheduled yes ? ?Notes to clinic:Pt has appt tomorrow. Please review. Thank you. ? ?Requested Prescriptions  ?Pending Prescriptions Disp Refills  ? levothyroxine (SYNTHROID) 50 MCG tablet 30 tablet 0  ?  ? Endocrinology:  Hypothyroid Agents Failed - 11/11/2021  1:04 PM  ?  ?  Failed - TSH in normal range and within 360 days  ?  TSH  ?Date Value Ref Range Status  ?03/31/2019 0.89 0.40 - 4.50 mIU/L Final  ?  ?  ?  ?  Passed - Valid encounter within last 12 months  ?  Recent Outpatient Visits   ? ?      ? 10 months ago ETD (Eustachian tube dysfunction), left  ? [facility] [place], [place], NP  ? 11 months ago Seasonal allergic rhinitis due to other allergic trigger  ? [facility] [place], [place], NP  ? 1 year ago Acute otitis externa of right ear, unspecified type  ? [place], DO  ? 1 year ago Essential hypertension  ? [place], FNP  ? 2 years ago Epigastric pain  ? [place], DO  ? ?  ?  ?Future Appointments   ? ?        ? Tomorrow [place] Ranger, Devonne Doughty, DO [facility] [place]  ? ?  ? ? ?  ?  ?  ? ? ? ? ?

## 2021-11-13 ENCOUNTER — Encounter: Payer: Self-pay | Admitting: Family Medicine

## 2021-11-13 ENCOUNTER — Ambulatory Visit: Payer: BC Managed Care – PPO | Admitting: Family Medicine

## 2021-11-13 VITALS — BP 132/80 | HR 78 | Ht 65.0 in | Wt 189.6 lb

## 2021-11-13 DIAGNOSIS — E669 Obesity, unspecified: Secondary | ICD-10-CM

## 2021-11-13 DIAGNOSIS — E039 Hypothyroidism, unspecified: Secondary | ICD-10-CM | POA: Diagnosis not present

## 2021-11-13 DIAGNOSIS — I10 Essential (primary) hypertension: Secondary | ICD-10-CM

## 2021-11-13 DIAGNOSIS — F17200 Nicotine dependence, unspecified, uncomplicated: Secondary | ICD-10-CM | POA: Diagnosis not present

## 2021-11-13 DIAGNOSIS — Z0000 Encounter for general adult medical examination without abnormal findings: Secondary | ICD-10-CM

## 2021-11-13 DIAGNOSIS — Z1231 Encounter for screening mammogram for malignant neoplasm of breast: Secondary | ICD-10-CM

## 2021-11-13 DIAGNOSIS — Z122 Encounter for screening for malignant neoplasm of respiratory organs: Secondary | ICD-10-CM

## 2021-11-13 DIAGNOSIS — R7309 Other abnormal glucose: Secondary | ICD-10-CM

## 2021-11-13 DIAGNOSIS — E782 Mixed hyperlipidemia: Secondary | ICD-10-CM

## 2021-11-13 MED ORDER — LEVOTHYROXINE SODIUM 50 MCG PO TABS: 50.0000 ug | ORAL_TABLET | Freq: Every day | ORAL | 3 refills | Status: DC

## 2021-11-13 MED ORDER — HYDROCHLOROTHIAZIDE 25 MG PO TABS: 25.0000 mg | ORAL_TABLET | Freq: Every day | ORAL | 3 refills | Status: DC

## 2021-11-13 NOTE — Assessment & Plan Note (Signed)
Previously stable hypothyroidism ?Continue current dose Levothyroxine 54mg daily ?Check labs TSH T4 ?

## 2021-11-13 NOTE — Patient Instructions (Addendum)
Thank you for coming to the office today. ? ?Refilled meds ? ?Labs today ? ?For Mammogram screening for breast cancer  ? ?Call the [place] below anytime to schedule your own appointment now that order has been placed. ? ?[facility] ?[address] ?[place], [place] [address] ?Phone: [phone] ? ?For lung screening CT if not heard back ? ?[facility] ?[address], [address] ?[place], [place] ?Phone: [phone] ? ? ?DUE for FASTING BLOOD WORK (no food or drink after midnight before the lab appointment, only water or coffee without cream/sugar on the morning of) ? ?SCHEDULE "Lab Only" visit in the morning at the clinic for lab draw in 1 YEAR ? ?- Make sure Lab Only appointment is at about 1 week before your next appointment, so that results will be available ? ?For Lab Results, once available within 2-3 days of blood draw, you can can log in to MyChart online to view your results and a brief explanation. Also, we can discuss results at next follow-up visit. ? ? ?Please schedule a Follow-up Appointment to: Return in about 1 year (around 11/14/2022) for 1 year Annual Physical AM apt fasting lab AFTER. ? ?If you have any other questions or concerns, please feel free to call the office or send a message through [place]. You may also schedule an earlier appointment if necessary. ? ?Additionally, you may be receiving a survey about your experience at our office within a few days to 1 week by e-mail or mail. We value your feedback. ? ?Nobie Putnam, DO ?[place] ?

## 2021-11-13 NOTE — Assessment & Plan Note (Signed)
Well-controlled HTN ?No known complications  ?  ?Plan:  ?1. Continue current BP regimen HCTZ '25mg'$  daily ?2. Encourage improved lifestyle - low sodium diet, regular exercise ?3. Continue monitor BP outside office, bring readings to next visit, if persistently >140/90 or new symptoms notify office sooner ? ?

## 2021-11-13 NOTE — Progress Notes (Signed)
? ?Subjective:  ? ? Patient ID: Tammy Mccall, female    DOB: 1960/07/09, 61 y.o.   MRN: [id number] ? ?Tammy Mccall is a 61 y.o. female presenting on 11/13/2021 for Hypothyroidism and Annual Exam ? ? ?HPI ? ?Here for Annual Physical and Lab Orders ? ?CHRONIC HTN: ?Reports doing well on ?Current Meds - HCTZ '25mg'$  daily   ?Reports good compliance, took meds today. Tolerating well, w/o complaints. ?Denies CP, dyspnea, HA, edema, dizziness / lightheadedness ? ?Hypothyroidism ?Chronic problem. Fam history as well ?Doing well Levothyroxine 10mg daily, last pill tomorrow, needs re order and labs. ? ?Health Maintenance: ?Declines mammo, pap, shingrix ? ? ?  11/13/2021  ?  9:55 AM 12/12/2020  ?  2:45 PM 03/24/2019  ?  1:21 PM  ?Depression screen PHQ 2/9  ?Decreased Interest 0 0 0  ?Down, Depressed, Hopeless 0 0 0  ?PHQ - 2 Score 0 0 0  ?Altered sleeping '2 1 3  '$ ?Tired, decreased energy '2 1 3  '$ ?Change in appetite 2 1 0  ?Feeling bad or failure about yourself  0 0 0  ?Trouble concentrating 0 0 0  ?Moving slowly or fidgety/restless 0 0 0  ?Suicidal thoughts 0 0 0  ?PHQ-9 Score '6 3 6  '$ ?Difficult doing work/chores Not difficult at all Not difficult at all Somewhat difficult  ? ? ?Past Medical History:  ?Diagnosis Date  ? Colon polyps   ? GERD (gastroesophageal reflux disease)   ? tx maalox qhs  ? Hypertension   ? Hypothyroidism   ? Seasonal allergies   ? SVD (spontaneous vaginal delivery)   ? x 1  ? Thyroid disease   ? ?Past Surgical History:  ?Procedure Laterality Date  ? CESAREAN SECTION  1990's  ? x 1 Twins  ? CHOLECYSTECTOMY  2004  ? COLONOSCOPY  08/2013  ? COLONOSCOPY WITH PROPOFOL N/A 06/20/2020  ? Procedure: COLONOSCOPY WITH PROPOFOL;  Surgeon: WLucilla Lame MD;  [facility];  Service: Endoscopy;  Laterality: N/A;  ? ESOPHAGOGASTRODUODENOSCOPY (EGD) WITH PROPOFOL N/A 06/20/2020  ? Procedure: ESOPHAGOGASTRODUODENOSCOPY (EGD) WITH PROPOFOL;  Surgeon: WLucilla Lame MD;  [facility];   Service: Endoscopy;  Laterality: N/A;  ? MIDDLE EAR SURGERY    ? drum rupture  ? MULTIPLE TOOTH EXTRACTIONS    ? all teeth - dentures  ? NECK SURGERY    ? NECK SURGERY    ? stomach surgery    ? STOMACH SURGERY    ? THORACIC DISCECTOMY Right 12/02/2018  ? Procedure: Microdiscectomy - T12-L1 - L2-L3 - right;  Surgeon: CKary Kos MD;  [place] [facility]  Service: Neurosurgery;  Laterality: Right;  ? TONSILLECTOMY    ? TUBAL LIGATION    ? ?Social History  ? ?Socioeconomic History  ? Marital status: Married  ?  Spouse name: Not on file  ? Number of children: 3  ? Years of education: Not on file  ? Highest education level: Not on file  ?Occupational History  ? Not on file  ?Tobacco Use  ? Smoking status: Every Day  ?  Packs/day: 1.00  ?  Years: 50.00  ?  Pack years: 50.00  ?  Types: Cigarettes  ? Smokeless tobacco: Never  ?Vaping Use  ? Vaping Use: Never used  ?Substance and Sexual Activity  ? Alcohol use: Yes  ?  Alcohol/week: 0.0 standard drinks  ?  Comment: champagne  ? Drug use: No  ? Sexual activity: Not on file  ?Other Topics Concern  ? Not  on file  ?Social History Narrative  ? Not on file  ? ?Social Determinants of Health  ? ?Financial Resource Strain: Not on file  ?Food Insecurity: Not on file  ?Transportation Needs: Not on file  ?Physical Activity: Not on file  ?Stress: Not on file  ?Social Connections: Not on file  ?Intimate Partner Violence: Not on file  ? ?Family History  ?Problem Relation Age of Onset  ? Hashimoto's thyroiditis Mother   ? Alzheimer's disease Mother   ? Gallbladder disease Father   ? Arthritis Sister   ? Heart disease Brother   ? Diabetes Brother   ? Arthritis Brother   ? Arthritis Brother   ? Drug abuse Daughter   ? ?Current Outpatient Medications on File Prior to Visit  ?Medication Sig  ? albuterol (VENTOLIN HFA) 108 (90 Base) MCG/ACT inhaler albuterol sulfate HFA 90 mcg/actuation aerosol inhaler ? INHALE 2 PUFFS BY MOUTH EVERY 4 TO 6 HOURS AS NEEDED  ? hydrocortisone (ANUSOL-HC) 2.5 % rectal  cream Place 1 application rectally 2 (two) times daily.  ? ?No current facility-administered medications on file prior to visit.  ? ? ?Review of Systems  ?Constitutional:  Negative for activity change, appetite change, chills, diaphoresis, fatigue and fever.  ?HENT:  Negative for congestion and hearing loss.   ?Eyes:  Negative for visual disturbance.  ?Respiratory:  Negative for cough, chest tightness, shortness of breath and wheezing.   ?Cardiovascular:  Negative for chest pain, palpitations and leg swelling.  ?Gastrointestinal:  Negative for abdominal pain, constipation, diarrhea, nausea and vomiting.  ?Genitourinary:  Negative for dysuria, frequency and hematuria.  ?Musculoskeletal:  Negative for arthralgias and neck pain.  ?Skin:  Negative for rash.  ?Neurological:  Negative for dizziness, weakness, light-headedness, numbness and headaches.  ?Hematological:  Negative for adenopathy.  ?Psychiatric/Behavioral:  Negative for behavioral problems, dysphoric mood and sleep disturbance.   ?Per HPI unless specifically indicated above ? ? ?   ?Objective:  ?  ?BP 132/80   Pulse 78   Ht '5\' 5"'$  (1.651 m)   Wt 189 lb 9.6 oz (86 kg)   LMP  (LMP Unknown)   SpO2 100%   BMI 31.55 kg/m?   ?Wt Readings from Last 3 Encounters:  ?11/13/21 189 lb 9.6 oz (86 kg)  ?04/02/21 187 lb (84.8 kg)  ?12/26/20 184 lb (83.5 kg)  ?  ?Physical Exam ?Vitals and nursing note reviewed.  ?Constitutional:   ?   General: She is not in acute distress. ?   Appearance: She is well-developed. She is not diaphoretic.  ?   Comments: Well-appearing, comfortable, cooperative  ?HENT:  ?   Head: Normocephalic and atraumatic.  ?Eyes:  ?   General:     ?   Right eye: No discharge.     ?   Left eye: No discharge.  ?   Conjunctiva/sclera: Conjunctivae normal.  ?   Pupils: Pupils are equal, round, and reactive to light.  ?Neck:  ?   Thyroid: No thyromegaly.  ?Cardiovascular:  ?   Rate and Rhythm: Normal rate and regular rhythm.  ?   Pulses: Normal pulses.  ?    Heart sounds: Normal heart sounds. No murmur heard. ?Pulmonary:  ?   Effort: Pulmonary effort is normal. No respiratory distress.  ?   Breath sounds: Normal breath sounds. No wheezing or rales.  ?Abdominal:  ?   General: Bowel sounds are normal. There is no distension.  ?   Palpations: Abdomen is soft. There is no mass.  ?  Tenderness: There is no abdominal tenderness.  ?Musculoskeletal:     ?   General: No tenderness. Normal range of motion.  ?   Cervical back: Normal range of motion and neck supple.  ?   Comments: Upper / Lower Extremities: ?- Normal muscle tone, strength bilateral upper extremities 5/5, lower extremities 5/5  ?Lymphadenopathy:  ?   Cervical: No cervical adenopathy.  ?Skin: ?   General: Skin is warm and dry.  ?   Findings: No erythema or rash.  ?Neurological:  ?   Mental Status: She is alert and oriented to person, place, and time.  ?   Comments: Distal sensation intact to light touch all extremities  ?Psychiatric:     ?   Mood and Affect: Mood normal.     ?   Behavior: Behavior normal.     ?   Thought Content: Thought content normal.  ?   Comments: Well groomed, good eye contact, normal speech and thoughts  ? ? ? ?Results for orders placed or performed during the hospital encounter of 06/20/20  ?Surgical pathology  ?Result Value Ref Range  ? SURGICAL PATHOLOGY    ?  SURGICAL PATHOLOGY ?CASE: [phone] ?PATIENT: Tammy Mccall ?Surgical Pathology Report ? ? ? ? ?Specimen Submitted: ?A. Stomach, antrum; cbx ?B. Colon polyp x2, sigmoid; cs ?C. Colon polyp, transverse; cs ?D. Colon polyp, sigmoid; cbx ? ?Clinical History: History of colon polyps Z86.010 GERD K21.9, Erosive ?antrum gastritis; colon polyps ? ? ? ? ? ?DIAGNOSIS: ?A. STOMACH, ANTRUM; COLD BIOPSY: ?- ANTRAL MUCOSA WITH REACTIVE GASTRITIS. ?- NEGATIVE FOR H. PYLORI, DYSPLASIA, AND MALIGNANCY. ? ?B.  COLON POLYP X2, SIGMOID; COLD SNARE: ?- HYPERPLASTIC POLYP (3). ?- NEGATIVE FOR DYSPLASIA AND MALIGNANCY. ? ?C.  COLON POLYP, TRANSVERSE;  COLD SNARE: ?- HYPERPLASTIC POLYP. ?- NEGATIVE FOR DYSPLASIA AND MALIGNANCY. ? ?D.  COLON POLYP, SIGMOID; COLD BIOPSY: ?- TUBULAR ADENOMA. ?- NEGATIVE FOR HIGH-GRADE DYSPLASIA AND MALIGNANCY. ? ?GROSS DESCRIPTION

## 2021-11-14 LAB — CBC WITH DIFFERENTIAL/PLATELET
Absolute Monocytes: 502 cells/uL (ref 200–950)
Basophils Absolute: 23 cells/uL (ref 0–200)
Basophils Relative: 0.3 %
Eosinophils Absolute: 152 cells/uL (ref 15–500)
Eosinophils Relative: 2 %
HCT: 44.9 % (ref 35.0–45.0)
Hemoglobin: 14.9 g/dL (ref 11.7–15.5)
Lymphs Abs: 1550 cells/uL (ref 850–3900)
MCH: 31.4 pg (ref 27.0–33.0)
MCHC: 33.2 g/dL (ref 32.0–36.0)
MCV: 94.5 fL (ref 80.0–100.0)
MPV: 11.1 fL (ref 7.5–12.5)
Monocytes Relative: 6.6 %
Neutro Abs: 5373 cells/uL (ref 1500–7800)
Neutrophils Relative %: 70.7 %
Platelets: 264 10*3/uL (ref 140–400)
RBC: 4.75 10*6/uL (ref 3.80–5.10)
RDW: 12.4 % (ref 11.0–15.0)
Total Lymphocyte: 20.4 %
WBC: 7.6 10*3/uL (ref 3.8–10.8)

## 2021-11-14 LAB — T4, FREE: Free T4: 1.2 ng/dL (ref 0.8–1.8)

## 2021-11-14 LAB — HEMOGLOBIN A1C
Hgb A1c MFr Bld: 5.5 % of total Hgb (ref <5.7)
Mean Plasma Glucose: 111 mg/dL
eAG (mmol/L): 6.2 mmol/L

## 2021-11-14 LAB — COMPLETE METABOLIC PANEL WITH GFR
AG Ratio: 2.3 (calc) (ref 1.0–2.5)
ALT: 20 U/L (ref 6–29)
AST: 15 U/L (ref 10–35)
Albumin: 4.6 g/dL (ref 3.6–5.1)
Alkaline phosphatase (APISO): 56 U/L (ref 37–153)
BUN: 13 mg/dL (ref 7–25)
CO2: 29 mmol/L (ref 20–32)
Calcium: 9.7 mg/dL (ref 8.6–10.4)
Chloride: 103 mmol/L (ref 98–110)
Creat: 0.6 mg/dL (ref 0.50–1.05)
Globulin: 2 g/dL (calc) (ref 1.9–3.7)
Glucose, Bld: 85 mg/dL (ref 65–139)
Potassium: 4.3 mmol/L (ref 3.5–5.3)
Sodium: 140 mmol/L (ref 135–146)
Total Bilirubin: 0.4 mg/dL (ref 0.2–1.2)
Total Protein: 6.6 g/dL (ref 6.1–8.1)
eGFR: 102 mL/min/{1.73_m2} (ref >=60)

## 2021-11-14 LAB — LIPID PANEL
Cholesterol: 194 mg/dL (ref <200)
HDL: 81 mg/dL (ref >=50)
LDL Cholesterol (Calc): 96 mg/dL (calc)
Non-HDL Cholesterol (Calc): 113 mg/dL (calc) (ref <130)
Total CHOL/HDL Ratio: 2.4 (calc) (ref <5.0)
Triglycerides: 76 mg/dL (ref <150)

## 2021-11-14 LAB — TSH: TSH: 1.06 mIU/L (ref 0.40–4.50)

## 2021-12-29 ENCOUNTER — Other Ambulatory Visit: Payer: Self-pay | Admitting: Family Medicine

## 2021-12-29 DIAGNOSIS — I10 Essential (primary) hypertension: Secondary | ICD-10-CM

## 2021-12-29 NOTE — Telephone Encounter (Signed)
Medication Refill - Medication: hydrochlorothiazide (HYDRODIURIL) 25 MG tablet And pt is requesting BP Medication pt doesn't have medication name.   Has the patient contacted their pharmacy? Yes.   No,more refills.    (Agent: If yes, when and what did the pharmacy advise?)  [id number] Tammy Mccall, [place] - [address] AT [facility] [address] [facility]  [address] [place] [address]  Phone: [phone] Fax: [phone]  Hours: Not open 24 hours   Preferred Pharmacy (with phone number or street name):  Has the patient been seen for an appointment in the last year OR does the patient have an upcoming appointment? Yes.    Agent: Please be advised that RX refills may take up to 3 business days. We ask that you follow-up with your pharmacy.

## 2022-02-09 ENCOUNTER — Ambulatory Visit: Payer: BC Managed Care – PPO | Admitting: Podiatry

## 2022-02-13 ENCOUNTER — Ambulatory Visit: Payer: BC Managed Care – PPO | Admitting: Podiatry

## 2022-02-13 ENCOUNTER — Ambulatory Visit (INDEPENDENT_AMBULATORY_CARE_PROVIDER_SITE_OTHER): Payer: BC Managed Care – PPO

## 2022-02-13 DIAGNOSIS — M779 Enthesopathy, unspecified: Secondary | ICD-10-CM

## 2022-02-13 MED ORDER — MELOXICAM 15 MG PO TABS: 15.0000 mg | ORAL_TABLET | Freq: Every day | ORAL | 1 refills | Status: DC

## 2022-02-13 MED ORDER — METHYLPREDNISOLONE 4 MG PO TBPK: ORAL_TABLET | ORAL | 0 refills | Status: DC

## 2022-02-13 NOTE — Progress Notes (Signed)
   Chief Complaint  Patient presents with   Foot Injury   Foot Pain    Bilateral heel pain    HPI: 61 y.o. female presenting today for evaluation of generalized bilateral foot pain.  Patient states that she has a history of foot pain for several years.  Most recently she states that she was putting up a horse tarp and was on her feet for prolonged period of time.  She is now having pain throughout her entire feet.  This has been ongoing for about 4 months now.  She presents for further treatment evaluation  Past Medical History:  Diagnosis Date   Colon polyps    GERD (gastroesophageal reflux disease)    tx maalox qhs   Hypertension    Hypothyroidism    Seasonal allergies    SVD (spontaneous vaginal delivery)    x 1   Thyroid disease     Past Surgical History:  Procedure Laterality Date   CESAREAN SECTION  1990's   x 1 Twins   CHOLECYSTECTOMY  2004   COLONOSCOPY  08/2013   COLONOSCOPY WITH PROPOFOL N/A 06/20/2020   Procedure: COLONOSCOPY WITH PROPOFOL;  Surgeon: Lucilla Lame, MD;  Location: [facility];  Service: Endoscopy;  Laterality: N/A;   ESOPHAGOGASTRODUODENOSCOPY (EGD) WITH PROPOFOL N/A 06/20/2020   Procedure: ESOPHAGOGASTRODUODENOSCOPY (EGD) WITH PROPOFOL;  Surgeon: Lucilla Lame, MD;  Location: [facility];  Service: Endoscopy;  Laterality: N/A;   MIDDLE EAR SURGERY     drum rupture   MULTIPLE TOOTH EXTRACTIONS     all teeth - dentures   NECK SURGERY     NECK SURGERY     stomach surgery     STOMACH SURGERY     THORACIC DISCECTOMY Right 12/02/2018   Procedure: Microdiscectomy - T12-L1 - L2-L3 - right;  Surgeon: Kary Kos, MD;  Location: [place];  Service: Neurosurgery;  Laterality: Right;   TONSILLECTOMY     TUBAL LIGATION      No Known Allergies   Physical Exam: General: The patient is alert and oriented x3 in no acute distress.  Dermatology: Skin is warm, dry and supple bilateral lower extremities. Negative for open lesions or macerations.  Vascular:  Palpable pedal pulses bilaterally. Capillary refill within normal limits.  Negative for any significant edema or erythema  Neurological: Light touch and protective threshold grossly intact  Musculoskeletal Exam: No pedal deformities noted.  Generalized foot pain throughout the bilateral weightbearing surfaces of the feet both rear foot and forefoot  Radiographic Exam:  Normal osseous mineralization. Joint spaces preserved. No fracture/dislocation/boney destruction.    Assessment: 1.  Generalized foot pain bilateral x 4 months   Plan of Care:  1. Patient evaluated. X-Rays reviewed.  2.  Prescription for Medrol Dosepak 3.  Prescription for meloxicam 15 mg daily after completion of the Dosepak 4.  Continue wearing good supportive shoes and sneakers 5.  Return to clinic as needed      Edrick Kins, DPM [facility]  Dr. Edrick Kins, DPM    [address], [place] [address]                Office [phone]  Fax [phone]

## 2022-03-25 ENCOUNTER — Encounter: Payer: Self-pay | Admitting: Family Medicine

## 2022-03-25 ENCOUNTER — Telehealth: Payer: Self-pay

## 2022-03-25 DIAGNOSIS — M5136 Other intervertebral disc degeneration, lumbar region: Secondary | ICD-10-CM | POA: Insufficient documentation

## 2022-03-25 NOTE — Telephone Encounter (Unsigned)
Copied from [place] [id number]. Topic: General - Other >> Mar 24, 2022  9:25 AM WLSLHTDS J wrote: Reason for CRM: pt is calling in to request a note to excuse her from jury duty. Pt says that she has back issues from having several surgeries that prevents her from sitting a long period of time.   Juror Date 05/05/2022 Juror number [id number] - pt would like to have this faxed to her if possible.   Fax: [phone]  Please assist further.

## 2022-03-26 NOTE — Telephone Encounter (Signed)
Please notify patient that I have written the note and it is now in my to be faxed box. It should be sent out Friday 03/27/22.  Hopefully they will excuse her, but the final decision is up to them.  Thanks  Nobie Putnam, [place] [facility] 03/26/2022, 1:56 PM

## 2022-04-01 NOTE — Telephone Encounter (Signed)
Pt wanting to come by the office and pick up a copy of the excuse letter for jury duty  Please call pt once letter is ready for pick up

## 2022-04-01 NOTE — Telephone Encounter (Signed)
Rachell spoke with her and she is aware that she can come pick it up. It is up front.

## 2022-09-26 ENCOUNTER — Other Ambulatory Visit: Payer: Self-pay | Admitting: Family Medicine

## 2022-09-26 DIAGNOSIS — E039 Hypothyroidism, unspecified: Secondary | ICD-10-CM

## 2022-09-26 DIAGNOSIS — I10 Essential (primary) hypertension: Secondary | ICD-10-CM

## 2022-09-28 NOTE — Telephone Encounter (Signed)
Unable to refill per protocol, Rx request is too soon. Last refill 11/13/21 for 90 days and 3 refills. Next refill due in April.  Requested Prescriptions  Pending Prescriptions Disp Refills   levothyroxine (SYNTHROID) 50 MCG tablet [Pharmacy Med Name: LEVOTHYROXINE 0.05MG  (50MCG) TAB] 90 tablet 3    Sig: TAKE 1 TABLET(50 MCG) BY MOUTH DAILY BEFORE BREAKFAST     Endocrinology:  Hypothyroid Agents Passed - 09/26/2022  7:02 AM      Passed - TSH in normal range and within 360 days    TSH  Date Value Ref Range Status  11/13/2021 1.06 0.40 - 4.50 mIU/L Final         Passed - Valid encounter within last 12 months    Recent Outpatient Visits           10 months ago Annual physical exam   [facility] [place], DO   1 year ago ETD (Eustachian tube dysfunction), left   [facility] [place], Coralie Keens, NP   1 year ago Seasonal allergic rhinitis due to other allergic trigger   [facility] [place], Coralie Keens, NP   2 years ago Acute otitis externa of right ear, unspecified type   [facility] [place], Devonne Doughty, DO   2 years ago Essential hypertension   [facility] [place], Lupita Raider, [place]               hydrochlorothiazide (HYDRODIURIL) 25 MG tablet [Pharmacy Med Name: HYDROCHLOROTHIAZIDE 25MG  TABLETS] 90 tablet 3    Sig: TAKE 1 TABLET(25 MG) BY MOUTH DAILY     Cardiovascular: Diuretics - Thiazide Failed - 09/26/2022  7:02 AM      Failed - Cr in normal range and within 180 days    Creat  Date Value Ref Range Status  11/13/2021 0.60 0.50 - 1.05 mg/dL Final         Failed - K in normal range and within 180 days    Potassium  Date Value Ref Range Status  11/13/2021 4.3 3.5 - 5.3 mmol/L Final         Failed - Na in normal range and within 180 days    Sodium  Date Value Ref Range Status  11/13/2021 140 135 - 146 mmol/L Final          Failed - Valid encounter within last 6 months    Recent Outpatient Visits           10 months ago Annual physical exam   [place], DO   1 year ago ETD (Eustachian tube dysfunction), left   [facility] [place], Coralie Keens, NP   1 year ago Seasonal allergic rhinitis due to other allergic trigger   [facility] [place], Coralie Keens, NP   2 years ago Acute otitis externa of right ear, unspecified type   [place], DO   2 years ago Essential hypertension   [facility] [place], [place], [place]              Passed - Last BP in normal range    BP Readings from Last 1 Encounters:  11/13/21 132/80

## 2022-10-01 NOTE — Telephone Encounter (Signed)
Pt called back asking about her refills and if she can get them now   she has about a weeks worth left/

## 2022-10-14 ENCOUNTER — Other Ambulatory Visit: Payer: Self-pay | Admitting: Internal Medicine

## 2022-10-14 DIAGNOSIS — I10 Essential (primary) hypertension: Secondary | ICD-10-CM

## 2022-10-15 NOTE — Telephone Encounter (Signed)
Requested Prescriptions  Pending Prescriptions Disp Refills   hydrochlorothiazide (HYDRODIURIL) 25 MG tablet [Pharmacy Med Name: HYDROCHLOROTHIAZIDE 25MG  TABLETS] 90 tablet 0    Sig: TAKE 1 TABLET(25 MG) BY MOUTH DAILY     Cardiovascular: Diuretics - Thiazide Failed - 10/14/2022  4:57 PM      Failed - Cr in normal range and within 180 days    Creat  Date Value Ref Range Status  11/13/2021 0.60 0.50 - 1.05 mg/dL Final         Failed - K in normal range and within 180 days    Potassium  Date Value Ref Range Status  11/13/2021 4.3 3.5 - 5.3 mmol/L Final         Failed - Na in normal range and within 180 days    Sodium  Date Value Ref Range Status  11/13/2021 140 135 - 146 mmol/L Final         Failed - Valid encounter within last 6 months    Recent Outpatient Visits           11 months ago Annual physical exam   [place] [facility] Smitty Cords, DO   1 year ago ETD (Eustachian tube dysfunction), left   [place] [facility] [place], Salvadore [place], NP   1 year ago Seasonal allergic rhinitis due to other allergic trigger   [place] [facility] [place], Salvadore [place], NP   2 years ago Acute otitis externa of right ear, unspecified type   [place] Smitty Cords, DO   2 years ago Essential hypertension   [place] [facility] [place], Jodelle Gross, FNP       Future Appointments             In 1 week Althea Charon Netta Neat, DO [facility]            Passed - Last BP in normal range    BP Readings from Last 1 Encounters:  11/13/21 132/80

## 2022-10-19 ENCOUNTER — Other Ambulatory Visit: Payer: Self-pay | Admitting: Family Medicine

## 2022-10-19 DIAGNOSIS — E039 Hypothyroidism, unspecified: Secondary | ICD-10-CM

## 2022-10-19 NOTE — Telephone Encounter (Signed)
Medication Refill - Medication: levothyroxine (SYNTHROID) 50 MCG tablet   Pt is completely out of her current supply   Has the patient contacted their pharmacy? Yes.   (Agent: If no, request that the patient contact the pharmacy for the refill. If patient does not wish to contact the pharmacy document the reason why and proceed with request.) (Agent: If yes, when and what did the pharmacy advise?)  Preferred Pharmacy (with phone number or street name):  [facility] #[id number] Cheree Ditto,  - [address] AT [facility] OF [address] [facility]  [address] [place] [address]  Phone: [phone] Fax: [phone]   Has the patient been seen for an appointment in the last year OR does the patient have an upcoming appointment? Yes.    Agent: Please be advised that RX refills may take up to 3 business days. We ask that you follow-up with your pharmacy.

## 2022-10-20 MED ORDER — LEVOTHYROXINE SODIUM 50 MCG PO TABS: 50.0000 ug | ORAL_TABLET | Freq: Every day | ORAL | 0 refills | Status: DC

## 2022-10-20 NOTE — Telephone Encounter (Signed)
Requested Prescriptions  Pending Prescriptions Disp Refills   levothyroxine (SYNTHROID) 50 MCG tablet 90 tablet 0    Sig: Take 1 tablet (50 mcg total) by mouth daily before breakfast.     Endocrinology:  Hypothyroid Agents Passed - 10/19/2022 12:51 PM      Passed - TSH in normal range and within 360 days    TSH  Date Value Ref Range Status  11/13/2021 1.06 0.40 - 4.50 mIU/L Final         Passed - Valid encounter within last 12 months    Recent Outpatient Visits           11 months ago Annual physical exam   [place] [place], Netta Neat, DO   1 year ago ETD (Eustachian tube dysfunction), left   [place] [facility] [place], Salvadore Oxford, NP   1 year ago Seasonal allergic rhinitis due to other allergic trigger   [place] [facility] [place], Salvadore Oxford, NP   2 years ago Acute otitis externa of right ear, unspecified type   [place] [facility] Smitty Cords, DO   2 years ago Essential hypertension   [place] [facility], Jodelle Gross, FNP       Future Appointments             In 3 days Althea Charon, Netta Neat, DO  [facility] [place], [facility]

## 2022-10-23 ENCOUNTER — Ambulatory Visit: Payer: BC Managed Care – PPO | Admitting: Family Medicine

## 2022-11-11 ENCOUNTER — Ambulatory Visit: Payer: BC Managed Care – PPO | Admitting: Family Medicine

## 2023-01-09 ENCOUNTER — Other Ambulatory Visit: Payer: Self-pay | Admitting: Family Medicine

## 2023-01-09 ENCOUNTER — Other Ambulatory Visit: Payer: Self-pay | Admitting: Internal Medicine

## 2023-01-09 DIAGNOSIS — I10 Essential (primary) hypertension: Secondary | ICD-10-CM

## 2023-01-09 DIAGNOSIS — E039 Hypothyroidism, unspecified: Secondary | ICD-10-CM

## 2023-01-11 NOTE — Telephone Encounter (Signed)
Requested medications are due for refill today.  yes  Requested medications are on the active medications list.  yes  Last refill. 10/19/2021 #90 0 rf  Future visit scheduled.   no  Notes to clinic.  Labs are expired. Pt needs an OV.    Requested Prescriptions  Pending Prescriptions Disp Refills   levothyroxine (SYNTHROID) 50 MCG tablet [Pharmacy Med Name: LEVOTHYROXINE 0.05MG  ( ) TAB] 90 tablet 0    Sig: TAKE 1 TABLET(50 MCG) BY MOUTH DAILY BEFORE BREAKFAST     Endocrinology:  Hypothyroid Agents Failed - 01/09/2023  7:02 AM      Failed - TSH in normal range and within 360 days    TSH  Date Value Ref Range Status  11/13/2021 1.06 0.40 - 4.50 mIU/L Final         Failed - Valid encounter within last 12 months    Recent Outpatient Visits           1 year ago Annual physical exam   [place] [facility] [place], Netta Neat, DO   2 years ago ETD (Eustachian tube dysfunction), left   [place] [facility] [place], [place] W, NP   2 years ago Seasonal allergic rhinitis due to other allergic trigger   [place] [facility] [place], Salvadore Oxford, NP   2 years ago Acute otitis externa of right ear, unspecified type   [place] [facility] Smitty Cords, DO   2 years ago Essential hypertension    [facility] [place], Jodelle Gross, [place]

## 2023-01-11 NOTE — Telephone Encounter (Signed)
Requested medications are due for refill today.  yes  Requested medications are on the active medications list.  yes  Last refill. 10/15/2022 #90 0 rf  Future visit scheduled.   no  Notes to clinic.  Labs are expired. Pt needs an OV.    Requested Prescriptions  Pending Prescriptions Disp Refills   hydrochlorothiazide (HYDRODIURIL) 25 MG tablet [Pharmacy Med Name: HYDROCHLOROTHIAZIDE 25MG  TABLETS] 90 tablet 0    Sig: TAKE 1 TABLET(25 MG) BY MOUTH DAILY     Cardiovascular: Diuretics - Thiazide Failed - 01/09/2023  7:02 AM      Failed - Cr in normal range and within 180 days    Creat  Date Value Ref Range Status  11/13/2021 0.60 0.50 - 1.05 mg/dL Final         Failed - K in normal range and within 180 days    Potassium  Date Value Ref Range Status  11/13/2021 4.3 3.5 - 5.3 mmol/L Final         Failed - Na in normal range and within 180 days    Sodium  Date Value Ref Range Status  11/13/2021 140 135 - 146 mmol/L Final         Failed - Valid encounter within last 6 months    Recent Outpatient Visits           1 year ago Annual physical exam   [place] [facility] Smitty Cords, DO   2 years ago ETD (Eustachian tube dysfunction), left   [place] [facility] [place], [place] W, NP   2 years ago Seasonal allergic rhinitis due to other allergic trigger   [place] [facility] [place], Salvadore Oxford, NP   2 years ago Acute otitis externa of right ear, unspecified type   [place] [facility] Smitty Cords, DO   2 years ago Essential hypertension    [place] [facility] [place], [place], [place]              Passed - Last BP in normal range    BP Readings from Last 1 Encounters:  11/13/21 132/80

## 2023-01-12 ENCOUNTER — Ambulatory Visit: Payer: Self-pay

## 2023-01-12 NOTE — Telephone Encounter (Signed)
  Chief Complaint: Upper abdominal pain - had some diarrhea - now resolved Symptoms: above Frequency: 4 days Pertinent Negatives: Patient denies vomiting Disposition: [] ED /[x] Urgent Care (no appt availability in office) / [] Appointment(In office/virtual)/ []  [place] [facility]/ [] Home Care/ [] Refused Recommended Disposition /[] [place] [facility]/ []  Follow-up with PCP Additional Notes: Pt ate a hamburger the other day, and noticed that the grill had been cleaned with a wire brush. Pt is afraid that there may have been a piece of wire in her burger which is now stuck and causing her pain. Pt states that pain comes and goes. No vomiting. Pt did have diarrhea, which has resolved. No appts in office. Pt will go to UC for care.    Reason for Disposition  [1] MODERATE pain (e.g., interferes with normal activities) AND [2] pain comes and goes (cramps) AND [3] present > 24 hours  (Exception: Pain with Vomiting or Diarrhea - see that Guideline.)  Answer Assessment - Initial Assessment Questions 1. LOCATION: "Where does it hurt?"      Where stomach starts 2. RADIATION: "Does the pain shoot anywhere else?" (e.g., chest, back)     *No Answer* 3. ONSET: "When did the pain begin?" (e.g., minutes, hours or days ago)      4 days 4. SUDDEN: "Gradual or sudden onset?"     quick 5. PATTERN "Does the pain come and go, or is it constant?"    - If it comes and goes: "How long does it last?" "Do you have pain now?"     (Note: Comes and goes means the pain is intermittent. It goes away completely between bouts.)    - If constant: "Is it getting better, staying the same, or getting worse?"      (Note: Constant means the pain never goes away completely; most serious pain is constant and gets worse.)      Comes and goes 6. SEVERITY: "How bad is the pain?"  (e.g., Scale 1-10; mild, moderate, or severe)    - MILD (1-3): Doesn't interfere with normal activities, abdomen soft and not tender to touch.     -  MODERATE (4-7): Interferes with normal activities or awakens from sleep, abdomen tender to touch.     - SEVERE (8-10): Excruciating pain, doubled over, unable to do any normal activities.       6/10 8. CAUSE: "What do you think is causing the stomach pain?"     Stabbing 10. OTHER SYMPTOMS: "Do you have any other symptoms?" (e.g., back pain, diarrhea, fever, urination pain, vomiting)       no  Protocols used: Abdominal Pain - Female-A-AH

## 2023-01-12 NOTE — Telephone Encounter (Signed)
Agreed w/ UC vs ED. This is not something I would be able to resolve here. She may need imaging or endoscopy if they are concerned for foreign body.  Tammy Pilar, DO [facility] 01/12/2023, 12:52 PM

## 2023-01-12 NOTE — Telephone Encounter (Signed)
FYI

## 2023-01-14 ENCOUNTER — Encounter: Payer: Self-pay | Admitting: Family Medicine

## 2023-01-14 ENCOUNTER — Ambulatory Visit: Payer: BC Managed Care – PPO | Admitting: Family Medicine

## 2023-01-14 VITALS — BP 132/84 | HR 81 | Temp 98.0°F | Resp 18 | Ht 65.0 in | Wt 187.6 lb

## 2023-01-14 DIAGNOSIS — I10 Essential (primary) hypertension: Secondary | ICD-10-CM | POA: Diagnosis not present

## 2023-01-14 DIAGNOSIS — E039 Hypothyroidism, unspecified: Secondary | ICD-10-CM | POA: Diagnosis not present

## 2023-01-14 DIAGNOSIS — R7309 Other abnormal glucose: Secondary | ICD-10-CM | POA: Diagnosis not present

## 2023-01-14 DIAGNOSIS — F17200 Nicotine dependence, unspecified, uncomplicated: Secondary | ICD-10-CM | POA: Diagnosis not present

## 2023-01-14 DIAGNOSIS — E782 Mixed hyperlipidemia: Secondary | ICD-10-CM

## 2023-01-14 DIAGNOSIS — Z1231 Encounter for screening mammogram for malignant neoplasm of breast: Secondary | ICD-10-CM

## 2023-01-14 MED ORDER — LEVOTHYROXINE SODIUM 50 MCG PO TABS
50.0000 ug | ORAL_TABLET | Freq: Every day | ORAL | 3 refills | Status: DC
Start: 2023-01-14 — End: 2024-01-14

## 2023-01-14 MED ORDER — HYDROCHLOROTHIAZIDE 25 MG PO TABS: 25.0000 mg | ORAL_TABLET | Freq: Every day | ORAL | 3 refills | Status: DC

## 2023-01-14 NOTE — Progress Notes (Signed)
Subjective:    Patient ID: Tammy Mccall, female    DOB: 1961/06/08, 62 y.o.   MRN: [id number]  Tammy Mccall is a 62 y.o. female presenting on 01/14/2023 for Hypertension and Hypothyroidism   HPI  CHRONIC HTN: Reports doing well on Current Meds - HCTZ 25mg  daily   Reports good compliance, took meds today. Tolerating well, w/o complaints. Denies CP, dyspnea, HA, edema, dizziness / lightheadedness  Cholesteatoma S/p ear excision, tympanoplasty [facility] ENT following her for hearing loss    Hypothyroidism Chronic problem. Fam history as well Doing well Levothyroxine daily, last pill tomorrow, needs re order and labs.  Health Maintenance: Due for mammogram.     01/14/2023    8:32 AM 11/13/2021    9:55 AM 12/12/2020    2:45 PM  Depression screen PHQ 2/9  Decreased Interest 1 0 0  Down, Depressed, Hopeless 1 0 0  PHQ - 2 Score 2 0 0  Altered sleeping 3 2 1   Tired, decreased energy 2 2 1   Change in appetite 2 2 1   Feeling bad or failure about yourself  2 0 0  Trouble concentrating 0 0 0  Moving slowly or fidgety/restless 0 0 0  Suicidal thoughts 0 0 0  PHQ-9 Score 11 6 3   Difficult doing work/chores Somewhat difficult Not difficult at all Not difficult at all      01/14/2023    8:33 AM 11/13/2021    9:55 AM 12/12/2020    2:45 PM  GAD 7 : Generalized Anxiety Score  Nervous, Anxious, on Edge 1 0 0  Control/stop worrying 1 0 0  Worry too much - different things 1 0 1  Trouble relaxing 1 2 1   Restless 1 0 1  Easily annoyed or irritable 2 0 1  Afraid - awful might happen 0 0 0  Total GAD 7 Score 7 2 4   Anxiety Difficulty Not difficult at all Not difficult at all Not difficult at all      Social History   Tobacco Use   Smoking status: Every Day    Current packs/day: 1.00    Average packs/day: 1 pack/day for 104.5 years (104.5 ttl pk-yrs)    Types: Cigarettes    Start date: 1970   Smokeless tobacco: Never  Vaping Use   Vaping status: Never Used   Substance Use Topics   Alcohol use: Not Currently    Comment: champagne   Drug use: No    Review of Systems  Constitutional:  Negative for activity change, appetite change, chills, diaphoresis, fatigue and fever.  HENT:  Negative for congestion and hearing loss.   Eyes:  Negative for visual disturbance.  Respiratory:  Negative for cough, chest tightness, shortness of breath and wheezing.   Cardiovascular:  Negative for chest pain, palpitations and leg swelling.  Gastrointestinal:  Negative for abdominal pain, constipation, diarrhea, nausea and vomiting.  Genitourinary:  Negative for dysuria, frequency and hematuria.  Musculoskeletal:  Negative for arthralgias and neck pain.  Skin:  Negative for rash.  Neurological:  Negative for dizziness, weakness, light-headedness, numbness and headaches.  Hematological:  Negative for adenopathy.  Psychiatric/Behavioral:  Negative for behavioral problems, dysphoric mood and sleep disturbance.    Per HPI unless specifically indicated above     Objective:    BP 132/84 (BP Location: Right Arm, Patient Position: Sitting, Cuff Size: Large)   Pulse 81   Temp 98 F (36.7 C) (Oral)   Resp 18   Ht 5\' 5"  (1.651  m)   Wt 187 lb 9.6 oz (85.1 kg)   LMP  (LMP Unknown)   SpO2 98%   BMI 31.22 kg/m   Wt Readings from Last 3 Encounters:  01/14/23 187 lb 9.6 oz (85.1 kg)  11/13/21 189 lb 9.6 oz (86 kg)  04/02/21 187 lb (84.8 kg)    Physical Exam Vitals and nursing note reviewed.  Constitutional:      General: She is not in acute distress.    Appearance: She is well-developed. She is not diaphoretic.     Comments: Well-appearing, comfortable, cooperative  HENT:     Head: Normocephalic and atraumatic.  Eyes:     General:        Right eye: No discharge.        Left eye: No discharge.     Conjunctiva/sclera: Conjunctivae normal.     Pupils: Pupils are equal, round, and reactive to light.  Neck:     Thyroid: No thyromegaly.  Cardiovascular:      Rate and Rhythm: Normal rate and regular rhythm.     Pulses: Normal pulses.     Heart sounds: Normal heart sounds. No murmur heard. Pulmonary:     Effort: Pulmonary effort is normal. No respiratory distress.     Breath sounds: Normal breath sounds. No wheezing or rales.  Abdominal:     General: Bowel sounds are normal. There is no distension.     Palpations: Abdomen is soft. There is no mass.     Tenderness: There is no abdominal tenderness.  Musculoskeletal:        General: No tenderness. Normal range of motion.     Cervical back: Normal range of motion and neck supple.     Right lower leg: No edema.     Left lower leg: No edema.     Comments: Upper / Lower Extremities: - Normal muscle tone, strength bilateral upper extremities 5/5, lower extremities 5/5  Lymphadenopathy:     Cervical: No cervical adenopathy.  Skin:    General: Skin is warm and dry.     Findings: No erythema or rash.  Neurological:     Mental Status: She is alert and oriented to person, place, and time.     Comments: Distal sensation intact to light touch all extremities  Psychiatric:        Mood and Affect: Mood normal.        Behavior: Behavior normal.        Thought Content: Thought content normal.     Comments: Well groomed, good eye contact, normal speech and thoughts       Results for orders placed or performed in visit on 11/13/21  COMPLETE METABOLIC PANEL WITH GFR  Result Value Ref Range   Glucose, Bld 85 65 - 139 mg/dL   BUN 13 7 - 25 mg/dL   Creat 1.61 0.96 - 0.45 mg/dL   eGFR 409 > OR = 60 WJ/XBJ/4.78G9   BUN/Creatinine Ratio NOT APPLICABLE 6 - 22 (calc)   Sodium 140 135 - 146 mmol/L   Potassium 4.3 3.5 - 5.3 mmol/L   Chloride 103 98 - 110 mmol/L   CO2 29 20 - 32 mmol/L   Calcium 9.7 8.6 - 10.4 mg/dL   Total Protein 6.6 6.1 - 8.1 g/dL   Albumin 4.6 3.6 - 5.1 g/dL   Globulin 2.0 1.9 - 3.7 g/dL (calc)   AG Ratio 2.3 1.0 - 2.5 (calc)   Total Bilirubin 0.4 0.2 - 1.2 mg/dL   Alkaline  phosphatase (APISO) 56 37 - 153 U/L   AST 15 10 - 35 U/L   ALT 20 6 - 29 U/L  CBC with Differential/Platelet  Result Value Ref Range   WBC 7.6 3.8 - 10.8 Thousand/uL   RBC 4.75 3.80 - 5.10 Million/uL   Hemoglobin 14.9 11.7 - 15.5 g/dL   HCT 16.1 09.6 - 04.5 %   MCV 94.5 80.0 - 100.0 fL   MCH 31.4 27.0 - 33.0 pg   MCHC 33.2 32.0 - 36.0 g/dL   RDW 40.9 81.1 - 91.4 %   Platelets 264 140 - 400 Thousand/uL   MPV 11.1 7.5 - 12.5 fL   Neutro Abs 5,373 1,500 - 7,800 cells/uL   Lymphs Abs 1,550 850 - 3,900 cells/uL   Absolute Monocytes 502 200 - 950 cells/uL   Eosinophils Absolute 152 15 - 500 cells/uL   Basophils Absolute 23 0 - 200 cells/uL   Neutrophils Relative % 70.7 %   Total Lymphocyte 20.4 %   Monocytes Relative 6.6 %   Eosinophils Relative 2.0 %   Basophils Relative 0.3 %  Lipid panel  Result Value Ref Range   Cholesterol 194 <200 mg/dL   HDL 81 > OR = 50 mg/dL   Triglycerides 76 <782 mg/dL   LDL Cholesterol (Calc) 96 mg/dL (calc)   Total CHOL/HDL Ratio 2.4 <5.0 (calc)   Non-HDL Cholesterol (Calc) 113 <130 mg/dL (calc)  Hemoglobin N5A  Result Value Ref Range   Hgb A1c MFr Bld 5.5 <5.7 % of total Hgb   Mean Plasma Glucose 111 mg/dL   eAG (mmol/L) 6.2 mmol/L  TSH  Result Value Ref Range   TSH 1.06 0.40 - 4.50 mIU/L  T4, free  Result Value Ref Range   Free T4 1.2 0.8 - 1.8 ng/dL      Assessment & Plan:   Problem List Items Addressed This Visit     Acquired hypothyroidism - Primary    Previously stable hypothyroidism Continue current dose Levothyroxine daily Check labs TSH T4 today      Relevant Medications   levothyroxine (SYNTHROID) 50 MCG tablet   Other Relevant Orders   CBC with Differential/Platelet   TSH   T4, free   Essential hypertension    Well-controlled HTN No known complications    Plan:  1. Continue current BP regimen HCTZ 25mg  daily - discuss may consider dose reduction to 12.5mg  dose in future, may benefit from less thiazide, if she  is not staying as well hydrated. Goal to improve hydration 2. Encourage improved lifestyle - low sodium diet, regular exercise 3. Continue monitor BP outside office, bring readings to next visit, if persistently >140/90 or new symptoms notify office sooner       Relevant Medications   hydrochlorothiazide (HYDRODIURIL) 25 MG tablet   Other Relevant Orders   CBC with Differential/Platelet   Tobacco dependence   Other Visit Diagnoses     Abnormal glucose       Relevant Orders   Hemoglobin A1c   Mixed hyperlipidemia       Relevant Medications   hydrochlorothiazide (HYDRODIURIL) 25 MG tablet   Other Relevant Orders   COMPLETE METABOLIC PANEL WITH GFR   Lipid panel   Encounter for screening mammogram for malignant neoplasm of breast       Relevant Orders   MM 3D SCREENING MAMMOGRAM BILATERAL BREAST       Updated Health Maintenance information Fasting labs ordered today Encouraged improvement to lifestyle with diet and exercise Goal  maintain healthy weight.  Mammogram ordered today, she will call to schedule.    Orders Placed This Encounter  Procedures   MM 3D SCREENING MAMMOGRAM BILATERAL BREAST    Standing Status:   Future    Standing Expiration Date:   01/14/2024    Order Specific Question:   Reason for Exam (SYMPTOM  OR DIAGNOSIS REQUIRED)    Answer:   Screening bilateral 3D Mammogram [place]    Order Specific Question:   Preferred imaging location?    Answer:   [facility]   COMPLETE METABOLIC PANEL WITH GFR   CBC with Differential/Platelet   Hemoglobin A1c   Lipid panel    Order Specific Question:   Has the patient fasted?    Answer:   Yes   TSH   T4, free     Meds ordered this encounter  Medications   hydrochlorothiazide (HYDRODIURIL) 25 MG tablet    Sig: Take 1 tablet (25 mg total) by mouth daily.    Dispense:  90 tablet    Refill:  3   levothyroxine (SYNTHROID) 50 MCG tablet    Sig: Take 1 tablet (50 mcg total) by mouth daily before breakfast.     Dispense:  90 tablet    Refill:  3    Add future refills      Follow up plan: Return in about 1 year (around 01/14/2024) for 1 year Annual Physical AM lab fasting after.   Saralyn Pilar, DO [facility] 01/14/2023, 8:49 AM

## 2023-01-14 NOTE — Assessment & Plan Note (Addendum)
Well-controlled HTN No known complications    Plan:  1. Continue current BP regimen HCTZ 25mg  daily - discuss may consider dose reduction to 12.5mg  dose in future, may benefit from less thiazide, if she is not staying as well hydrated. Goal to improve hydration 2. Encourage improved lifestyle - low sodium diet, regular exercise 3. Continue monitor BP outside office, bring readings to next visit, if persistently >140/90 or new symptoms notify office sooner

## 2023-01-14 NOTE — Patient Instructions (Addendum)
Thank you for coming to the office today.  For Mammogram screening for breast cancer   Call the Imaging Center below anytime to schedule your own appointment now that order has been placed.  [facility] [address] [place], [place] [address] Phone: [phone]  ---------------------------  Labs ordered.  BP medication renew. Okay to keep taking this dose. It is working well. Goal to increase water intake hydration, since it is a fluid pill and can dry you up a bit.   DUE for FASTING BLOOD WORK (no food or drink after midnight before the lab appointment, only water or coffee without cream/sugar on the morning of)  Today stay tuned for results  Please schedule a Follow-up Appointment to: Return in about 1 year (around 01/14/2024) for 1 year Annual Physical AM lab fasting after.  If you have any other questions or concerns, please feel free to call the office or send a message through MyChart. You may also schedule an earlier appointment if necessary.  Additionally, you may be receiving a survey about your experience at our office within a few days to 1 week by e-mail or mail. We value your feedback.  Saralyn Pilar, DO [facility], [place]

## 2023-01-14 NOTE — Assessment & Plan Note (Signed)
Previously stable hypothyroidism Continue current dose Levothyroxine daily Check labs TSH T4 today

## 2023-01-15 ENCOUNTER — Other Ambulatory Visit: Payer: Self-pay | Admitting: Family Medicine

## 2023-01-15 LAB — HEMOGLOBIN A1C
Hgb A1c MFr Bld: 6 % of total Hgb — ABNORMAL HIGH (ref <5.7)
Mean Plasma Glucose: 126 mg/dL
eAG (mmol/L): 7 mmol/L

## 2023-01-15 LAB — CBC WITH DIFFERENTIAL/PLATELET
Absolute Monocytes: 453 cells/uL (ref 200–950)
Basophils Absolute: 31 cells/uL (ref 0–200)
Basophils Relative: 0.5 %
Eosinophils Absolute: 99 cells/uL (ref 15–500)
Eosinophils Relative: 1.6 %
HCT: 45.3 % — ABNORMAL HIGH (ref 35.0–45.0)
Hemoglobin: 14.9 g/dL (ref 11.7–15.5)
Lymphs Abs: 1476 cells/uL (ref 850–3900)
MCH: 31 pg (ref 27.0–33.0)
MCHC: 32.9 g/dL (ref 32.0–36.0)
MCV: 94.2 fL (ref 80.0–100.0)
MPV: 11 fL (ref 7.5–12.5)
Monocytes Relative: 7.3 %
Neutro Abs: 4142 cells/uL (ref 1500–7800)
Neutrophils Relative %: 66.8 %
Platelets: 244 10*3/uL (ref 140–400)
RBC: 4.81 10*6/uL (ref 3.80–5.10)
RDW: 12.9 % (ref 11.0–15.0)
Total Lymphocyte: 23.8 %
WBC: 6.2 10*3/uL (ref 3.8–10.8)

## 2023-01-15 LAB — LIPID PANEL
Cholesterol: 194 mg/dL (ref <200)
HDL: 87 mg/dL (ref >=50)
LDL Cholesterol (Calc): 92 mg/dL (calc)
Non-HDL Cholesterol (Calc): 107 mg/dL (calc) (ref <130)
Total CHOL/HDL Ratio: 2.2 (calc) (ref <5.0)
Triglycerides: 64 mg/dL (ref <150)

## 2023-01-15 LAB — COMPLETE METABOLIC PANEL WITH GFR
AG Ratio: 2.2 (calc) (ref 1.0–2.5)
ALT: 25 U/L (ref 6–29)
AST: 19 U/L (ref 10–35)
Albumin: 4.3 g/dL (ref 3.6–5.1)
Alkaline phosphatase (APISO): 60 U/L (ref 37–153)
BUN: 19 mg/dL (ref 7–25)
CO2: 29 mmol/L (ref 20–32)
Calcium: 9.7 mg/dL (ref 8.6–10.4)
Chloride: 105 mmol/L (ref 98–110)
Creat: 0.8 mg/dL (ref 0.50–1.05)
Globulin: 2 g/dL (calc) (ref 1.9–3.7)
Glucose, Bld: 89 mg/dL (ref 65–99)
Potassium: 4.9 mmol/L (ref 3.5–5.3)
Sodium: 142 mmol/L (ref 135–146)
Total Bilirubin: 0.3 mg/dL (ref 0.2–1.2)
Total Protein: 6.3 g/dL (ref 6.1–8.1)
eGFR: 83 mL/min/{1.73_m2} (ref >=60)

## 2023-01-15 LAB — T4, FREE: Free T4: 1.1 ng/dL (ref 0.8–1.8)

## 2023-01-15 LAB — TSH: TSH: 1.56 mIU/L (ref 0.40–4.50)

## 2023-01-15 NOTE — Telephone Encounter (Signed)
Copied from CRM [id number]. Topic: General - Other >> Jan 15, 2023 12:42 PM Everette C wrote: Reason for CRM: Medication Refill - Medication: albuterol (VENTOLIN HFA) 108 (90 Base) MCG/ACT inhaler [[id number]]  Has the patient contacted their pharmacy? Yes.   (Agent: If no, request that the patient contact the pharmacy for the refill. If patient does not wish to contact the pharmacy document the reason why and proceed with request.) (Agent: If yes, when and what did the pharmacy advise?)  Preferred Pharmacy (with phone number or street name): [id number] [place],  - [address] AT [facility] OF [address] [place] [address] [place] [address] Phone: [phone] Fax: [phone] Hours: Not open 24 hours   Has the patient been seen for an appointment in the last year OR does the patient have an upcoming appointment? Yes.    Agent: Please be advised that RX refills may take up to 3 business days. We ask that you follow-up with your pharmacy.

## 2023-01-15 NOTE — Telephone Encounter (Signed)
Requested medication (s) are due for refill today: routing  for review  Requested medication (s) are on the active medication list: yes  Last refill:  04/02/21  Future visit scheduled:no  Notes to clinic:  Unable to refill per protocol, last refill by historical provider.      Requested Prescriptions  Pending Prescriptions Disp Refills   albuterol (VENTOLIN HFA) 108 (90 Base) MCG/ACT inhaler       Pulmonology:  Beta Agonists 2 Passed - 01/15/2023  1:40 PM      Passed - Last BP in normal range    BP Readings from Last 1 Encounters:  01/14/23 132/84         Passed - Last Heart Rate in normal range    Pulse Readings from Last 1 Encounters:  01/14/23 81         Passed - Valid encounter within last 12 months    Recent Outpatient Visits           Yesterday Acquired hypothyroidism   [place] [facility] Smitty Cords, DO   1 year ago Annual physical exam   [place] [facility] Smitty Cords, DO   2 years ago ETD (Eustachian tube dysfunction), left   [place] [facility] [place], [place] W, NP   2 years ago Seasonal allergic rhinitis due to other allergic trigger   [place] [facility] [place], Salvadore Oxford, NP   2 years ago Acute otitis externa of right ear, unspecified type   [place] [facility] [place], Netta Neat, DO       Future Appointments             In 12 months Althea Charon, Netta Neat, DO [facility]

## 2023-01-18 MED ORDER — ALBUTEROL SULFATE HFA 108 (90 BASE) MCG/ACT IN AERS: 1.0000 | INHALATION_SPRAY | RESPIRATORY_TRACT | 0 refills | Status: AC | PRN

## 2023-01-29 ENCOUNTER — Telehealth: Payer: Self-pay

## 2023-01-29 NOTE — Telephone Encounter (Signed)
Patient aware of results and recommendations. °

## 2023-01-29 NOTE — Telephone Encounter (Signed)
Copied from CRM [id number]. Topic: General - Inquiry >> Jan 29, 2023 10:00 AM Runell Gess P wrote: Reason for CRM: pt is calling about her labs from 2 weeks ago.  She never got the results  please advise [phone]

## 2023-01-29 NOTE — Telephone Encounter (Signed)
It looks like patient is not setup for mychart, and I was out of office. There has been a delay in reviewing.  Please notify her of lab results  A1c 6.0 (increased from prior range 5.5), this is mild early stage of Pre Diabetes. Goal is to limit excess carb starch sugar. No medication required.  Chemistry shows normal kidney and liver function  Blood count is normal  Cholesterol is controlled  Thyroid panel is in range. Continue current Levothyroxine dose.  ,Saralyn Pilar, DO [facility] 01/29/2023, 12:35 PM

## 2023-11-02 ENCOUNTER — Encounter: Payer: Self-pay | Admitting: Family Medicine

## 2023-11-02 ENCOUNTER — Ambulatory Visit: Payer: Self-pay

## 2023-11-02 ENCOUNTER — Ambulatory Visit (INDEPENDENT_AMBULATORY_CARE_PROVIDER_SITE_OTHER): Admitting: Family Medicine

## 2023-11-02 VITALS — BP 130/84 | HR 83 | Ht 65.0 in | Wt 188.5 lb

## 2023-11-02 DIAGNOSIS — K279 Peptic ulcer, site unspecified, unspecified as acute or chronic, without hemorrhage or perforation: Secondary | ICD-10-CM | POA: Diagnosis not present

## 2023-11-02 DIAGNOSIS — R1033 Periumbilical pain: Secondary | ICD-10-CM

## 2023-11-02 DIAGNOSIS — R1013 Epigastric pain: Secondary | ICD-10-CM

## 2023-11-02 DIAGNOSIS — K219 Gastro-esophageal reflux disease without esophagitis: Secondary | ICD-10-CM

## 2023-11-02 MED ORDER — OMEPRAZOLE 40 MG PO CPDR: 40.0000 mg | DELAYED_RELEASE_CAPSULE | Freq: Every day | ORAL | 2 refills | Status: DC

## 2023-11-02 NOTE — Patient Instructions (Addendum)
 Thank you for coming to the office today.  It also can be an abdominal wall strain as well, may take time to heal.   I am not sure the exact cause of your abdominal pain, however I am concerned that one significant possibility could be uncontrolled Acid Reflux (GERD) and may have developed an Ulcer (Peptic Ulcer of stomach).  Starting tomorrow before breakfast Omeprazole  40mg  daily. Prefer to take this med about 30 min before breakfast or 1st meal of day for 4 weeks, don't stop taking unless we discuss first. Probably will need for about 8 weeks total if this ends up being an Ulcer.   Okay to take maalox as needed  DIET RECOMMENDATIONS - Avoid spicy, greasy, fried foods, also things like caffeine, dark chocolate, peppermint can worsen - Avoid large meals and late night snacks, also do not go more than 4-5 hours without a snack or meal (not eating will worsen reflux symptoms due to stomach acid) - You may also elevate the head of your bed at night to sleep at very slight incline to help reduce symptoms   If the problem improves but keeps coming back, we can discuss higher dose or longer course at next visit.   If symptoms are worsening or persistent despite treatment or develop any different severe esophagus or abdominal pain, unable to swallow solids or liquids, nausea, vomiting especially blood in vomit, fever/chills, or unintentional weight loss / no appetite, please follow-up sooner in office or seek more immediate medical attention at hospital Emergency Department.  Regarding other medicines:   - STOP taking Ibuprofen, Advil, Motrin, Goody's / BC powder - DO NOT take without discussing with your doctor. These medicines can put you at high risk for future bleeding.  If need pain medicine, may take Tylenol  Extra Strength (Acetaminophen ) 500mg  tabs - take 1 to 2 tabs per dose (max 1000mg ) every 6-8 hours for pain (take regularly, don't skip a dose for next 7 days), max 24 hour daily dose is  6 tablets or 3000mg . In the future you can repeat the same everyday Tylenol  course for 1-2 weeks at a time.  Keep upcoming appointment.   Please schedule a Follow-up Appointment to: Return if symptoms worsen or fail to improve.  If you have any other questions or concerns, please feel free to call the office or send a message through MyChart. You may also schedule an earlier appointment if necessary.  Additionally, you may be receiving a survey about your experience at our office within a few days to 1 week by e-mail or mail. We value your feedback.  Domingo Friend, DO [facility], [place]

## 2023-11-02 NOTE — Progress Notes (Signed)
 Subjective:    Patient ID: Tammy Mccall, female    DOB: Apr 08, 1961, 63 y.o.   MRN: [id number]  Tammy Mccall is a 63 y.o. female presenting on 11/02/2023 for Gastroesophageal Reflux (Possible PUD epigastric pain)  Patient presents for a same day appointment.  HPI  Discussed the use of AI scribe software for clinical note transcription with the patient, who gave verbal consent to proceed.  History of Present Illness   Tammy Mccall is a 63 year old female who presents with abdominal pain and discomfort around the belly button.  She has been experiencing abdominal pain and discomfort around the umbilical region for the past three weeks. The pain began after she fell into a fish pond while trying to save her fish, which required her to pull herself out, causing initial pain. She describes the sensation as a pulling or tugging feeling from the back of her belly button, which worsens when she eats, causing swelling and pressure. The pain does not worsen with lifting or physical activity but is persistent and does not improve with rest.  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 She takes Maalox and Gas-X (simethicone ) for her stomach issues, particularly when eating, but does not take any other medications for stomach acid. She also takes levothyroxine  and hydrochlorothiazide  regularly.  No changes in bowel habits, with normal daily bowel movements. No drainage or liquid from the umbilical area. She reports not sleeping well and has a history of back surgery, which limits her physical activities. She does not take ibuprofen or similar medications.          11/02/2023    1:57 PM 01/14/2023    8:32 AM 11/13/2021    9:55 AM  Depression screen PHQ 2/9  Decreased Interest 1 1 0  Down, Depressed, Hopeless 1 1 0  PHQ - 2 Score 2 2 0  Altered sleeping 3 3  2   Tired, decreased energy 3 2 2   Change in appetite 2 2 2   Feeling bad or failure about yourself  0 2 0  Trouble concentrating 0 0 0  Moving slowly or fidgety/restless 0 0 0  Suicidal thoughts 0 0 0  PHQ-9 Score 10 11 6   Difficult doing work/chores Not difficult at all Somewhat difficult Not difficult at all       11/02/2023    1:57 PM 01/14/2023    8:33 AM 11/13/2021    9:55 AM 12/12/2020    2:45 PM  GAD 7 : Generalized Anxiety Score  Nervous, Anxious, on Edge 1 1 0 0  Control/stop worrying 1 1 0 0  Worry too much - different things 1 1 0 1  Trouble relaxing 1 1 2 1   Restless 0 1 0 1  Easily annoyed or irritable 1 2 0 1  Afraid - awful might happen 0 0 0 0  Total GAD 7 Score 5 7 2 4   Anxiety Difficulty Somewhat difficult Not difficult at all Not difficult at all Not difficult at all    Social History   Tobacco Use   Smoking status: Every Day    Current packs/day: 1.00    Average packs/day: 1 pack/day for 105.3 years (105.3 ttl pk-yrs)    Types: Cigarettes    Start date: 1970   Smokeless tobacco: Never  Vaping Use   Vaping status: Never Used  Substance Use Topics  Alcohol use: Not Currently    Comment: champagne   Drug use: No    Review of Systems Per HPI unless specifically indicated above     Objective:    BP 130/84 (BP Location: Left Arm, Patient Position: Sitting, Cuff Size: Normal)   Pulse 83   Ht 5\' 5"  (1.651 m)   Wt 188 lb 8 oz (85.5 kg)   LMP  (LMP Unknown)   SpO2 95%   BMI 31.37 kg/m   Wt Readings from Last 3 Encounters:  11/02/23 188 lb 8 oz (85.5 kg)  01/14/23 187 lb 9.6 oz (85.1 kg)  11/13/21 189 lb 9.6 oz (86 kg)    Physical Exam Vitals and nursing note reviewed.  Constitutional:      General: She is not in acute distress.    Appearance: Normal appearance. She is well-developed. She is not diaphoretic.     Comments: Well-appearing, comfortable, cooperative  HENT:     Head: Normocephalic and atraumatic.  Eyes:     General:         Right eye: No discharge.        Left eye: No discharge.     Conjunctiva/sclera: Conjunctivae normal.  Cardiovascular:     Rate and Rhythm: Normal rate.  Pulmonary:     Effort: Pulmonary effort is normal.  Abdominal:     General: Bowel sounds are normal. There is no distension.     Palpations: There is no mass.     Tenderness: There is abdominal tenderness (midline, above umbilicus into epigastric region). There is no guarding or rebound.     Hernia: No hernia (no umbilical or ventral hernia detected on provoked exam) is present.  Skin:    General: Skin is warm and dry.     Findings: No erythema or rash.  Neurological:     Mental Status: She is alert and oriented to person, place, and time.  Psychiatric:        Mood and Affect: Mood normal.        Behavior: Behavior normal.        Thought Content: Thought content normal.     Comments: Well groomed, good eye contact, normal speech and thoughts     Results for orders placed or performed in visit on 01/14/23  COMPLETE METABOLIC PANEL WITH GFR   Collection Time: 01/14/23  9:17 AM  Result Value Ref Range   Glucose, Bld 89 65 - 99 mg/dL   BUN 19 7 - 25 mg/dL   Creat 4.09 8.11 - 9.14 mg/dL   eGFR 83 > OR = 60 NW/GNF/6.21H0   BUN/Creatinine Ratio SEE NOTE: 6 - 22 (calc)   Sodium 142 135 - 146 mmol/L   Potassium 4.9 3.5 - 5.3 mmol/L   Chloride 105 98 - 110 mmol/L   CO2 29 20 - 32 mmol/L   Calcium  9.7 8.6 - 10.4 mg/dL   Total Protein 6.3 6.1 - 8.1 g/dL   Albumin 4.3 3.6 - 5.1 g/dL   Globulin 2.0 1.9 - 3.7 g/dL (calc)   AG Ratio 2.2 1.0 - 2.5 (calc)   Total Bilirubin 0.3 0.2 - 1.2 mg/dL   Alkaline phosphatase (APISO) 60 37 - 153 U/L   AST 19 10 - 35 U/L   ALT 25 6 - 29 U/L  CBC with Differential/Platelet   Collection Time: 01/14/23  9:17 AM  Result Value Ref Range   WBC 6.2 3.8 - 10.8 Thousand/uL   RBC 4.81 3.80 - 5.10 Million/uL  Hemoglobin 14.9 11.7 - 15.5 g/dL   HCT 16.1 (H) 09.6 - 04.5 %   MCV 94.2 80.0 - 100.0 fL    MCH 31.0 27.0 - 33.0 pg   MCHC 32.9 32.0 - 36.0 g/dL   RDW 40.9 81.1 - 91.4 %   Platelets 244 140 - 400 Thousand/uL   MPV 11.0 7.5 - 12.5 fL   Neutro Abs 4,142 1,500 - 7,800 cells/uL   Lymphs Abs 1,476 850 - 3,900 cells/uL   Absolute Monocytes 453 200 - 950 cells/uL   Eosinophils Absolute 99 15 - 500 cells/uL   Basophils Absolute 31 0 - 200 cells/uL   Neutrophils Relative % 66.8 %   Total Lymphocyte 23.8 %   Monocytes Relative 7.3 %   Eosinophils Relative 1.6 %   Basophils Relative 0.5 %  Hemoglobin A1c   Collection Time: 01/14/23  9:17 AM  Result Value Ref Range   Hgb A1c MFr Bld 6.0 (H) <5.7 % of total Hgb   Mean Plasma Glucose 126 mg/dL   eAG (mmol/L) 7.0 mmol/L  Lipid panel   Collection Time: 01/14/23  9:17 AM  Result Value Ref Range   Cholesterol 194 <200 mg/dL   HDL 87 > OR = 50 mg/dL   Triglycerides 64 <782 mg/dL   LDL Cholesterol (Calc) 92 mg/dL (calc)   Total CHOL/HDL Ratio 2.2 <5.0 (calc)   Non-HDL Cholesterol (Calc) 107 <130 mg/dL (calc)  TSH   Collection Time: 01/14/23  9:17 AM  Result Value Ref Range   TSH 1.56 0.40 - 4.50 mIU/L  T4, free   Collection Time: 01/14/23  9:17 AM  Result Value Ref Range   Free T4 1.1 0.8 - 1.8 ng/dL      Assessment & Plan:   Problem List Items Addressed This Visit     Gastroesophageal reflux disease   Relevant Medications   omeprazole  (PRILOSEC) 40 MG capsule   Other Visit Diagnoses       Epigastric abdominal pain    -  Primary   Relevant Medications   omeprazole  (PRILOSEC) 40 MG capsule     Periumbilical pain         PUD (peptic ulcer disease)       Relevant Medications   omeprazole  (PRILOSEC) 40 MG capsule        GERD PUD vs Abdominal wall strain Persistent abdominal discomfort possibly related to a stomach ulcer.  Not on NSAIDs, no clear etiology but she has prior history of PUD. Symptoms similar, seems can be related to eating. Does not seem to be as related to physical lifting moving or muscular but does  have some pain when sitting up.  - Prescribe Prilosec 40 mg once daily before breakfast for 4 to 8 weeks. - Advise avoidance of NSAIDs like ibuprofen and naproxen. - Allow use of Maalox as needed for symptomatic relief. - Instruct to monitor symptoms and report if no improvement after 4 to 6 weeks.   Possible as well underlying some MSK pain. No obvious hernia on exam with umbilicus, and no ventral wall hernia. No diastasis recti muscles Possible Pain likely due to muscle strain from recent physical activities - Advise rest and avoidance of heavy lifting. - Suggest wearing a supportive garment for pressure and support.        No orders of the defined types were placed in this encounter.   Meds ordered this encounter  Medications   omeprazole  (PRILOSEC) 40 MG capsule    Sig: Take 1 capsule (40  mg total) by mouth daily before breakfast.    Dispense:  30 capsule    Refill:  2    Follow up plan: Return if symptoms worsen or fail to improve.  Domingo Friend, DO [facility] 11/02/2023, 2:19 PM

## 2023-11-02 NOTE — Telephone Encounter (Signed)
 Chief Complaint: Abd pain Symptoms: pain around navel Frequency: x 3 weeks Pertinent Negatives: Patient denies fever, N/V/D Disposition: [] ED /[] Urgent Care (no appt availability in office) / [x] Appointment(In office/virtual)/ []  [place] [facility]/ [] Home Care/ [] Refused Recommended Disposition /[] [place]/ []  Follow-up with PCP Additional Notes: Pt reports she has been experiencing constant aching pain that varies in intensity x 3 weeks to her navel. Pt notes it feels "deep" and has not resolved or improved on its own. OV scheduled. This RN educated pt on home care, new-worsening symptoms, when to call back/seek emergent care. Pt verbalized understanding and agrees to plan.    Copied from CRM [id number]. Topic: Clinical - Red Word Triage >> Nov 02, 2023 10:41 AM Zipporah Him wrote: Red Word that prompted transfer to Nurse Triage: Belly button been hurting for 3 weeks, thinking it could be a muscle pulled. Reason for Disposition  [1] MILD-MODERATE pain AND [2] constant AND [3] present > 2 hours  Answer Assessment - Initial Assessment Questions 1. LOCATION: "Where does it hurt?"      Abd pain around navel 2. RADIATION: "Does the pain shoot anywhere else?" (e.g., chest, back)     None 3. ONSET: "When did the pain begin?" (e.g., minutes, hours or days ago)      X 3 weeks 4. SUDDEN: "Gradual or sudden onset?"     Sudden 5. PATTERN "Does the pain come and go, or is it constant?"    - If it comes and goes: "How long does it last?" "Do you have pain now?"     (Note: Comes and goes means the pain is intermittent. It goes away completely between bouts.)    - If constant: "Is it getting better, staying the same, or getting worse?"      (Note: Constant means the pain never goes away completely; most serious pain is constant and gets worse.)      Constant, intermittent intensity 6. SEVERITY: "How bad is the pain?"  (e.g., Scale 1-10; mild, moderate, or severe)    - MILD (1-3):  Doesn't interfere with normal activities, abdomen soft and not tender to touch.     - MODERATE (4-7): Interferes with normal activities or awakens from sleep, abdomen tender to touch.     - SEVERE (8-10): Excruciating pain, doubled over, unable to do any normal activities.       Ache that stays with her all the time 8. CAUSE: "What do you think is causing the stomach pain?"     Unknown, pt believes pulled muscle 10. OTHER SYMPTOMS: "Do you have any other symptoms?" (e.g., back pain, diarrhea, fever, urination pain, vomiting)       None  Protocols used: Abdominal Pain - Female-A-AH

## 2023-11-05 ENCOUNTER — Telehealth: Payer: Self-pay | Admitting: Gastroenterology

## 2023-11-05 NOTE — Telephone Encounter (Signed)
 The patient called and left a voicemail requesting to schedule an appointment with Dr. Baldomero Bone. I returned her call to confirm that we received her message.  The patient mentioned that she recently saw her PCP, Dr. Romeo Co, who recommended scheduling a follow-up with Dr. Baldomero Bone. Dr. Romeo Co stated that while he is unsure of the exact cause of her abdominal pain, he is concerned that uncontrolled acid reflux (GERD) may have led to the development of a peptic ulcer.  The patient is scheduled with Dr. Baldomero Bone on Nov 22, 2023, at 3:30 PM in [place]. A reminder has been sent out for the appointment.

## 2023-11-22 ENCOUNTER — Ambulatory Visit: Admitting: Gastroenterology

## 2024-01-14 ENCOUNTER — Ambulatory Visit (INDEPENDENT_AMBULATORY_CARE_PROVIDER_SITE_OTHER): Payer: Self-pay | Admitting: Family Medicine

## 2024-01-14 ENCOUNTER — Encounter: Payer: Self-pay | Admitting: Family Medicine

## 2024-01-14 VITALS — BP 144/82 | HR 73 | Ht 65.0 in | Wt 185.4 lb

## 2024-01-14 DIAGNOSIS — F17200 Nicotine dependence, unspecified, uncomplicated: Secondary | ICD-10-CM

## 2024-01-14 DIAGNOSIS — E782 Mixed hyperlipidemia: Secondary | ICD-10-CM | POA: Diagnosis not present

## 2024-01-14 DIAGNOSIS — E039 Hypothyroidism, unspecified: Secondary | ICD-10-CM

## 2024-01-14 DIAGNOSIS — Z0000 Encounter for general adult medical examination without abnormal findings: Secondary | ICD-10-CM

## 2024-01-14 DIAGNOSIS — I10 Essential (primary) hypertension: Secondary | ICD-10-CM | POA: Diagnosis not present

## 2024-01-14 DIAGNOSIS — Z124 Encounter for screening for malignant neoplasm of cervix: Secondary | ICD-10-CM

## 2024-01-14 DIAGNOSIS — K279 Peptic ulcer, site unspecified, unspecified as acute or chronic, without hemorrhage or perforation: Secondary | ICD-10-CM

## 2024-01-14 DIAGNOSIS — E559 Vitamin D deficiency, unspecified: Secondary | ICD-10-CM

## 2024-01-14 DIAGNOSIS — R7309 Other abnormal glucose: Secondary | ICD-10-CM

## 2024-01-14 DIAGNOSIS — Z1231 Encounter for screening mammogram for malignant neoplasm of breast: Secondary | ICD-10-CM

## 2024-01-14 DIAGNOSIS — E538 Deficiency of other specified B group vitamins: Secondary | ICD-10-CM

## 2024-01-14 DIAGNOSIS — R1013 Epigastric pain: Secondary | ICD-10-CM

## 2024-01-14 MED ORDER — LEVOTHYROXINE SODIUM 50 MCG PO TABS: 50.0000 ug | ORAL_TABLET | Freq: Every day | ORAL | 3 refills | Status: AC

## 2024-01-14 MED ORDER — HYDROCHLOROTHIAZIDE 25 MG PO TABS
25.0000 mg | ORAL_TABLET | Freq: Every day | ORAL | 3 refills | Status: AC
Start: 2024-01-14 — End: ?

## 2024-01-14 MED ORDER — OMEPRAZOLE 40 MG PO CPDR: 40.0000 mg | DELAYED_RELEASE_CAPSULE | Freq: Every day | ORAL | 3 refills | Status: AC

## 2024-01-14 NOTE — Patient Instructions (Addendum)
 Thank you for coming to the office today.  Labs today  Complete Biometric Form  Stay tuned for results  [facility] at [facility] [address] [place],  [place]  [address] Main: [phone]  For Mammogram screening for breast cancer   Call the Imaging Center below anytime to schedule your own appointment now that order has been placed.  [facility] at [facility] [address] [place], [place] [address] Phone: [phone]   You have been referred for a Coronary Calcium  Score Cardiac CT Scan. This is a screening test for patients aged 37-50+ with cardiovascular risk factors or who are healthy but would be interested in Cardiovascular Screening for heart disease. Even if there is a family history of heart disease, this imaging can be useful. Typically it can be done every 5+ years or at a different timeline we agree on  The scan will look at the chest and mainly focus on the heart and identify early signs of calcium  build up or blockages within the heart arteries. It is not 100% accurate for identifying blockages or heart disease, but it is useful to help us  predict who may have some early changes or be at risk in the future for a heart attack or cardiovascular problem.  The results are reviewed by a Cardiologist and they will document the results. It should become available on MyChart. Typically the results are divided into percentiles based on other patients of the same demographic and age. So it will compare your risk to others similar to you. If you have a higher score >99 or higher percentile >75%tile, it is recommended to consider Statin cholesterol therapy and or referral to Cardiologist. I will try to help explain your results and if we have questions we can contact the Cardiologist.  You will be contacted for scheduling. Usually it is done at any imaging facility through [facility], [facility] or [facility].  The cost is $99 flat fee total and it does not go through insurance, so no authorization is required.   Please schedule a Follow-up Appointment to: Return for 6 month PreDM A1c, Fatigue/Sleep Updates.  If you have any other questions or concerns, please feel free to call the office or send a message through MyChart. You may also schedule an earlier appointment if necessary.  Additionally, you may be receiving a survey about your experience at our office within a few days to 1 week by e-mail or mail. We value your feedback.  Marsa Officer, DO [facility], [place]

## 2024-01-14 NOTE — Progress Notes (Unsigned)
 Subjective:    Patient ID: Tammy Mccall, female    DOB: Jul 14, 1960, 63 y.o.   MRN: [id number]  Tammy Mccall is a 63 y.o. female presenting on 01/14/2024 for Annual Exam   HPI  Discussed the use of AI scribe software for clinical note transcription with the patient, who gave verbal consent to proceed.  History of Present Illness    ***Tired, poor sleep Stress Feeling like running on empty   Health Maintenance: ***     11/02/2023    1:57 PM 01/14/2023    8:32 AM 11/13/2021    9:55 AM  Depression screen PHQ 2/9  Decreased Interest 1 1 0  Down, Depressed, Hopeless 1 1 0  PHQ - 2 Score 2 2 0  Altered sleeping 3 3 2   Tired, decreased energy 3 2 2   Change in appetite 2 2 2   Feeling bad or failure about yourself  0 2 0  Trouble concentrating 0 0 0  Moving slowly or fidgety/restless 0 0 0  Suicidal thoughts 0 0 0  PHQ-9 Score 10 11 6   Difficult doing work/chores Not difficult at all Somewhat difficult Not difficult at all       11/02/2023    1:57 PM 01/14/2023    8:33 AM 11/13/2021    9:55 AM 12/12/2020    2:45 PM  GAD 7 : Generalized Anxiety Score  Nervous, Anxious, on Edge 1 1 0 0  Control/stop worrying 1 1 0 0  Worry too much - different things 1 1 0 1  Trouble relaxing 1 1 2 1   Restless 0 1 0 1  Easily annoyed or irritable 1 2 0 1  Afraid - awful might happen 0 0 0 0  Total GAD 7 Score 5 7 2 4   Anxiety Difficulty Somewhat difficult Not difficult at all Not difficult at all Not difficult at all     Past Medical History:  Diagnosis Date   Colon polyps    GERD (gastroesophageal reflux disease)    tx maalox qhs   Hypertension    Hypothyroidism    Seasonal allergies    SVD (spontaneous vaginal delivery)    x 1   Thyroid disease    Past Surgical History:  Procedure Laterality Date   CESAREAN SECTION  1990's   x 1 Twins   CHOLECYSTECTOMY  2004   COLONOSCOPY  08/2013   COLONOSCOPY WITH PROPOFOL  N/A 06/20/2020   Procedure: COLONOSCOPY  WITH PROPOFOL ;  Surgeon: Jinny Carmine, MD;  Location: [facility];  Service: Endoscopy;  Laterality: N/A;   ESOPHAGOGASTRODUODENOSCOPY (EGD) WITH PROPOFOL  N/A 06/20/2020   Procedure: ESOPHAGOGASTRODUODENOSCOPY (EGD) WITH PROPOFOL ;  Surgeon: Jinny Carmine, MD;  Location: [facility];  Service: Endoscopy;  Laterality: N/A;   MIDDLE EAR SURGERY     drum rupture   MULTIPLE TOOTH EXTRACTIONS     all teeth - dentures   NECK SURGERY     NECK SURGERY     stomach surgery     STOMACH SURGERY     THORACIC DISCECTOMY Right 12/02/2018   Procedure: Microdiscectomy - T12-L1 - L2-L3 - right;  Surgeon: Onetha Kuba, MD;  Location: [facility] OR;  Service: Neurosurgery;  Laterality: Right;   TONSILLECTOMY     TUBAL LIGATION     Social History   Socioeconomic History   Marital status: Married    Spouse name: Not on file   Number of children: 3   Years of education: Not on file   Highest education level: Not  on file  Occupational History   Not on file  Tobacco Use   Smoking status: Every Day    Current packs/day: 1.00    Average packs/day: 1 pack/day for 105.5 years (105.5 ttl pk-yrs)    Types: Cigarettes    Start date: 1970   Smokeless tobacco: Never  Vaping Use   Vaping status: Never Used  Substance and Sexual Activity   Alcohol use: Not Currently    Comment: champagne   Drug use: No   Sexual activity: Not on file  Other Topics Concern   Not on file  Social History Narrative   Not on file   Social Drivers of Health   Financial Resource Strain: Not on file  Food Insecurity: Not on file  Transportation Needs: Not on file  Physical Activity: Not on file  Stress: Not on file  Social Connections: Not on file  Intimate Partner Violence: Not on file   Family History  Problem Relation Age of Onset   Hashimoto's thyroiditis Mother    Alzheimer's disease Mother    Gallbladder disease Father    Arthritis Sister    Heart disease Brother    Diabetes Brother    Arthritis Brother     Arthritis Brother    Drug abuse Daughter    Current Outpatient Medications on File Prior to Visit  Medication Sig   albuterol  (VENTOLIN  HFA) 108 (90 Base) MCG/ACT inhaler Inhale 1-2 puffs into the lungs every 4 (four) hours as needed for wheezing or shortness of breath.   hydrochlorothiazide  (HYDRODIURIL ) 25 MG tablet Take 1 tablet (25 mg total) by mouth daily.   levothyroxine  (SYNTHROID ) 50 MCG tablet Take 1 tablet (50 mcg total) by mouth daily before breakfast.   omeprazole  (PRILOSEC) 40 MG capsule Take 1 capsule (40 mg total) by mouth daily before breakfast.   No current facility-administered medications on file prior to visit.    Review of Systems Per HPI unless specifically indicated above     Objective:    BP (!) 144/82 (BP Location: Left Arm, Patient Position: Sitting, Cuff Size: Normal)   Pulse 73   Ht 5' 5 (1.651 m)   Wt 185 lb 6 oz (84.1 kg)   LMP  (LMP Unknown)   SpO2 100%   BMI 30.85 kg/m   Wt Readings from Last 3 Encounters:  01/14/24 185 lb 6 oz (84.1 kg)  11/02/23 188 lb 8 oz (85.5 kg)  01/14/23 187 lb 9.6 oz (85.1 kg)    Physical Exam  Results for orders placed or performed in visit on 01/14/23  COMPLETE METABOLIC PANEL WITH GFR   Collection Time: 01/14/23  9:17 AM  Result Value Ref Range   Glucose, Bld 89 65 - 99 mg/dL   BUN 19 7 - 25 mg/dL   Creat 9.19 9.49 - 8.94 mg/dL   eGFR 83 > OR = 60 fO/fpw/8.26f7   BUN/Creatinine Ratio SEE NOTE: 6 - 22 (calc)   Sodium 142 135 - 146 mmol/L   Potassium 4.9 3.5 - 5.3 mmol/L   Chloride 105 98 - 110 mmol/L   CO2 29 20 - 32 mmol/L   Calcium  9.7 8.6 - 10.4 mg/dL   Total Protein 6.3 6.1 - 8.1 g/dL   Albumin 4.3 3.6 - 5.1 g/dL   Globulin 2.0 1.9 - 3.7 g/dL (calc)   AG Ratio 2.2 1.0 - 2.5 (calc)   Total Bilirubin 0.3 0.2 - 1.2 mg/dL   Alkaline phosphatase (APISO) 60 37 - 153 U/L   AST  19 10 - 35 U/L   ALT 25 6 - 29 U/L  CBC with Differential/Platelet   Collection Time: 01/14/23  9:17 AM  Result Value Ref Range    WBC 6.2 3.8 - 10.8 Thousand/uL   RBC 4.81 3.80 - 5.10 Million/uL   Hemoglobin 14.9 11.7 - 15.5 g/dL   HCT 54.6 (H) 64.9 - 54.9 %   MCV 94.2 80.0 - 100.0 fL   MCH 31.0 27.0 - 33.0 pg   MCHC 32.9 32.0 - 36.0 g/dL   RDW 87.0 88.9 - 84.9 %   Platelets 244 140 - 400 Thousand/uL   MPV 11.0 7.5 - 12.5 fL   Neutro Abs 4,142 1,500 - 7,800 cells/uL   Lymphs Abs 1,476 850 - 3,900 cells/uL   Absolute Monocytes 453 200 - 950 cells/uL   Eosinophils Absolute 99 15 - 500 cells/uL   Basophils Absolute 31 0 - 200 cells/uL   Neutrophils Relative % 66.8 %   Total Lymphocyte 23.8 %   Monocytes Relative 7.3 %   Eosinophils Relative 1.6 %   Basophils Relative 0.5 %  Hemoglobin A1c   Collection Time: 01/14/23  9:17 AM  Result Value Ref Range   Hgb A1c MFr Bld 6.0 (H) <5.7 % of total Hgb   Mean Plasma Glucose 126 mg/dL   eAG (mmol/L) 7.0 mmol/L  Lipid panel   Collection Time: 01/14/23  9:17 AM  Result Value Ref Range   Cholesterol 194 <200 mg/dL   HDL 87 > OR = 50 mg/dL   Triglycerides 64 <849 mg/dL   LDL Cholesterol (Calc) 92 mg/dL (calc)   Total CHOL/HDL Ratio 2.2 <5.0 (calc)   Non-HDL Cholesterol (Calc) 107 <130 mg/dL (calc)  TSH   Collection Time: 01/14/23  9:17 AM  Result Value Ref Range   TSH 1.56 0.40 - 4.50 mIU/L  T4, free   Collection Time: 01/14/23  9:17 AM  Result Value Ref Range   Free T4 1.1 0.8 - 1.8 ng/dL      Assessment & Plan:   Problem List Items Addressed This Visit   None    Updated Health Maintenance information ***- Reviewed recent lab results with patient Encouraged improvement to lifestyle with diet and exercise -*** Goal of weight loss  Assessment and Plan Assessment & Plan      No orders of the defined types were placed in this encounter.   No orders of the defined types were placed in this encounter.    Follow up plan: No follow-ups on file.  Marsa Officer, DO [facility] 01/14/2024,  8:26 AM

## 2024-01-15 ENCOUNTER — Ambulatory Visit: Payer: Self-pay | Admitting: Family Medicine

## 2024-01-15 LAB — HEMOGLOBIN A1C
Hgb A1c MFr Bld: 5.9 % — ABNORMAL HIGH (ref <5.7)
Mean Plasma Glucose: 123 mg/dL
eAG (mmol/L): 6.8 mmol/L

## 2024-01-15 LAB — CBC WITH DIFFERENTIAL/PLATELET
Absolute Lymphocytes: 1495 {cells}/uL (ref 850–3900)
Absolute Monocytes: 372 {cells}/uL (ref 200–950)
Basophils Absolute: 18 {cells}/uL (ref 0–200)
Basophils Relative: 0.3 %
Eosinophils Absolute: 122 {cells}/uL (ref 15–500)
Eosinophils Relative: 2 %
HCT: 46.4 % — ABNORMAL HIGH (ref 35.0–45.0)
Hemoglobin: 14.8 g/dL (ref 11.7–15.5)
MCH: 30.2 pg (ref 27.0–33.0)
MCHC: 31.9 g/dL — ABNORMAL LOW (ref 32.0–36.0)
MCV: 94.7 fL (ref 80.0–100.0)
MPV: 10.6 fL (ref 7.5–12.5)
Monocytes Relative: 6.1 %
Neutro Abs: 4093 {cells}/uL (ref 1500–7800)
Neutrophils Relative %: 67.1 %
Platelets: 226 Thousand/uL (ref 140–400)
RBC: 4.9 Million/uL (ref 3.80–5.10)
RDW: 13.1 % (ref 11.0–15.0)
Total Lymphocyte: 24.5 %
WBC: 6.1 Thousand/uL (ref 3.8–10.8)

## 2024-01-15 LAB — COMPREHENSIVE METABOLIC PANEL WITH GFR
AG Ratio: 2.3 (calc) (ref 1.0–2.5)
ALT: 18 U/L (ref 6–29)
AST: 14 U/L (ref 10–35)
Albumin: 4.1 g/dL (ref 3.6–5.1)
Alkaline phosphatase (APISO): 60 U/L (ref 37–153)
BUN: 19 mg/dL (ref 7–25)
CO2: 29 mmol/L (ref 20–32)
Calcium: 8.8 mg/dL (ref 8.6–10.4)
Chloride: 105 mmol/L (ref 98–110)
Creat: 0.83 mg/dL (ref 0.50–1.05)
Globulin: 1.8 g/dL — ABNORMAL LOW (ref 1.9–3.7)
Glucose, Bld: 94 mg/dL (ref 65–99)
Potassium: 3.7 mmol/L (ref 3.5–5.3)
Sodium: 141 mmol/L (ref 135–146)
Total Bilirubin: 0.3 mg/dL (ref 0.2–1.2)
Total Protein: 5.9 g/dL — ABNORMAL LOW (ref 6.1–8.1)
eGFR: 79 mL/min/1.73m2 (ref >=60)

## 2024-01-15 LAB — LIPID PANEL
Cholesterol: 180 mg/dL (ref <200)
HDL: 73 mg/dL (ref >=50)
LDL Cholesterol (Calc): 92 mg/dL
Non-HDL Cholesterol (Calc): 107 mg/dL (ref <130)
Total CHOL/HDL Ratio: 2.5 (calc) (ref <5.0)
Triglycerides: 64 mg/dL (ref <150)

## 2024-01-15 LAB — TSH: TSH: 0.89 m[IU]/L (ref 0.40–4.50)

## 2024-01-15 LAB — T4, FREE: Free T4: 1.5 ng/dL (ref 0.8–1.8)

## 2024-01-15 LAB — VITAMIN D 25 HYDROXY (VIT D DEFICIENCY, FRACTURES): Vit D, 25-Hydroxy: 32 ng/mL (ref 30–100)

## 2024-01-15 LAB — VITAMIN B12: Vitamin B-12: 400 pg/mL (ref 200–1100)

## 2024-01-17 NOTE — Telephone Encounter (Signed)
 Left message for patient to call back regarding results, ok for E2C2 to give results.

## 2024-01-17 NOTE — Telephone Encounter (Signed)
-----   Message from Marsa Officer sent at 01/15/2024  8:01 AM EDT ----- Please call with lab results, no mychart  Elevated A1c is stable at 5.9, last year 6.0. This is mild pre-diabetes. Keep improving diet.  No anemia. Slight increase Hematocrit due to smoking history. No other concern on blood counts.  Chemistry kidney liver normal. There is a mild protein deficiency seen. Goal to improve nutrition intake with protein.  Thyroid is normal  Cholesterol is controlled  Vitamin D  and B12 normal rnage  Marsa Officer, DO [facility] 01/15/2024, 8:01 AM  ----- Message ----- From: Interface, [facility] Results In Sent: 01/14/2024  10:42 PM EDT To: Marsa JINNY Officer, DO

## 2024-01-21 ENCOUNTER — Ambulatory Visit
Admission: RE | Admit: 2024-01-21 | Discharge: 2024-01-21 | Disposition: A | Discharge status: Home / Self Care | Payer: Self-pay | Source: Ambulatory Visit | Attending: Family Medicine | Admitting: Family Medicine

## 2024-01-21 DIAGNOSIS — F17200 Nicotine dependence, unspecified, uncomplicated: Secondary | ICD-10-CM | POA: Insufficient documentation

## 2024-01-21 DIAGNOSIS — E782 Mixed hyperlipidemia: Secondary | ICD-10-CM | POA: Insufficient documentation

## 2024-01-21 DIAGNOSIS — I10 Essential (primary) hypertension: Secondary | ICD-10-CM | POA: Insufficient documentation

## 2024-01-26 ENCOUNTER — Other Ambulatory Visit: Payer: Self-pay | Admitting: Family Medicine

## 2024-01-26 DIAGNOSIS — E782 Mixed hyperlipidemia: Secondary | ICD-10-CM

## 2024-01-26 MED ORDER — ROSUVASTATIN CALCIUM 10 MG PO TABS: 10.0000 mg | ORAL_TABLET | Freq: Every day | ORAL | 3 refills | Status: AC

## 2024-02-27 NOTE — Progress Notes (Deleted)
 [place] Marsa PARAS, DO   No chief complaint on file.   HPI:      Ms. Tammy Mccall is a 63 y.o. [phone] whose LMP was No LMP recorded (lmp unknown). Patient is postmenopausal., presents today for NP pap smear, referred by PCP (physical done 7/25).     Patient Active Problem List   Diagnosis Date Noted   DDD (degenerative disc disease), lumbar 03/25/2022   Gastroesophageal reflux disease    Body mass index (BMI) 28.0-28.9, adult 01/05/2020   Lumbago of lumbar region with sciatica 01/05/2020   Neuropathy 01/05/2020   Essential hypertension 03/27/2019   Acquired hypothyroidism 03/27/2019   Tobacco dependence 03/27/2019   HNP (herniated nucleus pulposus), lumbar 12/02/2018   Abdominal pain, chronic, right upper quadrant 01/18/2014    Past Surgical History:  Procedure Laterality Date   CESAREAN SECTION  1990's   x 1 Twins   CHOLECYSTECTOMY  2004   COLONOSCOPY  08/2013   COLONOSCOPY WITH PROPOFOL  N/A 06/20/2020   Procedure: COLONOSCOPY WITH PROPOFOL ;  Surgeon: Jinny Carmine, MD;  Location: [facility];  Service: Endoscopy;  Laterality: N/A;   ESOPHAGOGASTRODUODENOSCOPY (EGD) WITH PROPOFOL  N/A 06/20/2020   Procedure: ESOPHAGOGASTRODUODENOSCOPY (EGD) WITH PROPOFOL ;  Surgeon: Jinny Carmine, MD;  Location: [facility];  Service: Endoscopy;  Laterality: N/A;   MIDDLE EAR SURGERY     drum rupture   MULTIPLE TOOTH EXTRACTIONS     all teeth - dentures   NECK SURGERY     NECK SURGERY     stomach surgery     STOMACH SURGERY     THORACIC DISCECTOMY Right 12/02/2018   Procedure: Microdiscectomy - T12-L1 - L2-L3 - right;  Surgeon: Onetha Kuba, MD;  Location: [facility] OR;  Service: Neurosurgery;  Laterality: Right;   TONSILLECTOMY     TUBAL LIGATION      Family History  Problem Relation Age of Onset   Hashimoto's thyroiditis Mother    Alzheimer's disease Mother    Gallbladder disease Father    Arthritis Sister    Heart disease Brother    Diabetes Brother     Arthritis Brother    Arthritis Brother    Drug abuse Daughter     Social History   Socioeconomic History   Marital status: Married    Spouse name: Not on file   Number of children: 3   Years of education: Not on file   Highest education level: Not on file  Occupational History   Not on file  Tobacco Use   Smoking status: Every Day    Current packs/day: 1.00    Average packs/day: 1 pack/day for 105.6 years (105.6 ttl pk-yrs)    Types: Cigarettes    Start date: 1970   Smokeless tobacco: Never  Vaping Use   Vaping status: Never Used  Substance and Sexual Activity   Alcohol use: Not Currently    Comment: champagne   Drug use: No   Sexual activity: Not on file  Other Topics Concern   Not on file  Social History Narrative   Not on file   Social Drivers of Health   Financial Resource Strain: Not on file  Food Insecurity: Not on file  Transportation Needs: Not on file  Physical Activity: Not on file  Stress: Not on file  Social Connections: Not on file  Intimate Partner Violence: Not on file    Outpatient Medications Prior to Visit  Medication Sig Dispense Refill   albuterol  (VENTOLIN  HFA) 108 (90 Base) MCG/ACT inhaler Inhale  1-2 puffs into the lungs every 4 (four) hours as needed for wheezing or shortness of breath. 8 g 0   hydrochlorothiazide  (HYDRODIURIL ) 25 MG tablet Take 1 tablet (25 mg total) by mouth daily. 90 tablet 3   levothyroxine  (SYNTHROID ) 50 MCG tablet Take 1 tablet (50 mcg total) by mouth daily before breakfast. 90 tablet 3   omeprazole  (PRILOSEC) 40 MG capsule Take 1 capsule (40 mg total) by mouth daily before breakfast. 90 capsule 3   rosuvastatin  (CRESTOR ) 10 MG tablet Take 1 tablet (10 mg total) by mouth at bedtime. 90 tablet 3   No facility-administered medications prior to visit.      ROS:  Review of Systems BREAST: No symptoms   OBJECTIVE:   Vitals:  LMP  (LMP Unknown)   Physical Exam  Results: No results found for this or any  previous visit (from the past 24 hours).   Assessment/Plan: No diagnosis found.    No orders of the defined types were placed in this encounter.     No follow-ups on file.  Mayla Biddy B. Jennifier Smitherman, PA-C 02/27/2024 4:34 PM

## 2024-02-29 ENCOUNTER — Encounter: Admitting: Obstetrics and Gynecology

## 2024-02-29 DIAGNOSIS — Z1151 Encounter for screening for human papillomavirus (HPV): Secondary | ICD-10-CM

## 2024-02-29 DIAGNOSIS — Z124 Encounter for screening for malignant neoplasm of cervix: Secondary | ICD-10-CM

## 2024-03-03 ENCOUNTER — Encounter: Admitting: Obstetrics and Gynecology

## 2024-03-27 ENCOUNTER — Other Ambulatory Visit: Payer: Self-pay | Admitting: Physician Assistant

## 2024-03-27 DIAGNOSIS — M79661 Pain in right lower leg: Secondary | ICD-10-CM

## 2024-03-29 ENCOUNTER — Ambulatory Visit
Admission: RE | Admit: 2024-03-29 | Discharge: 2024-03-29 | Disposition: A | Discharge status: Home / Self Care | Source: Ambulatory Visit | Attending: Physician Assistant | Admitting: Physician Assistant

## 2024-03-29 DIAGNOSIS — M79661 Pain in right lower leg: Secondary | ICD-10-CM | POA: Diagnosis present

## 2024-04-17 ENCOUNTER — Telehealth: Payer: Self-pay

## 2024-04-17 DIAGNOSIS — K429 Umbilical hernia without obstruction or gangrene: Secondary | ICD-10-CM

## 2024-04-17 DIAGNOSIS — R1033 Periumbilical pain: Secondary | ICD-10-CM

## 2024-04-17 NOTE — Telephone Encounter (Signed)
 Please notify patient that I have placed the referral to General Surgery at [facility] Dr Tye.  I have seen her for this symptom before but I have not been able to identify or diagnose a hernia, so we will get another opinion from Dr Tye general surgery.  Marsa Officer, DO [facility] 04/17/2024, 12:58 PM

## 2024-04-17 NOTE — Addendum Note (Signed)
 Addended by: EDMAN MARSA PARAS on: 04/17/2024 12:58 PM   Modules accepted: Orders

## 2024-04-17 NOTE — Telephone Encounter (Signed)
 Left message for patient to return call OK to advise  referral has been placed.

## 2024-04-17 NOTE — Telephone Encounter (Signed)
 Copied from CRM [id number]. Topic: Referral - Question >> Apr 17, 2024 10:24 AM Precious C wrote: Reason for CRM: Patient called in requesting to speak with her provider regarding a referral for her hernia. She stated that she has previously seen her provider for this issue but would now like to be referred to a specialist for further evaluation and care.   Provider requested to see for hernia: Henriette Pierre at [facility]

## 2024-06-06 ENCOUNTER — Other Ambulatory Visit: Payer: Self-pay | Admitting: Family Medicine

## 2024-06-06 DIAGNOSIS — I10 Essential (primary) hypertension: Secondary | ICD-10-CM

## 2024-06-06 DIAGNOSIS — E039 Hypothyroidism, unspecified: Secondary | ICD-10-CM

## 2024-06-06 NOTE — Telephone Encounter (Unsigned)
 Copied from CRM #[id number]. Topic: Clinical - Medication Refill >> Jun 06, 2024 10:31 AM Burnard DEL wrote: Medication: levothyroxine  (SYNTHROID ) 50 MCG tablet  hydrochlorothiazide  (HYDRODIURIL ) 25 MG tablet  Has the patient contacted their pharmacy? Yes (Agent: If no, request that the patient contact the pharmacy for the refill. If patient does not wish to contact the pharmacy document the reason why and proceed with request.) (Agent: If yes, when and what did the pharmacy advise?)  This is the patient's preferred pharmacy:  [id number] [place], [place] - [address] AT [facility] [address] [place] [address] Phone: [phone] Fax: [phone]  Is this the correct pharmacy for this prescription? Yes If no, delete pharmacy and type the correct one.   Has the prescription been filled recently? No  Is the patient out of the medication? Yes  Has the patient been seen for an appointment in the last year OR does the patient have an upcoming appointment? Yes  Can we respond through MyChart? Yes  Agent: Please be advised that Rx refills may take up to 3 business days. We ask that you follow-up with your pharmacy.

## 2024-06-09 NOTE — Telephone Encounter (Signed)
 Too soon for refill, LRF 01/14/24 for 90 /3RF.  Requested Prescriptions  Pending Prescriptions Disp Refills   hydrochlorothiazide  (HYDRODIURIL ) 25 MG tablet 90 tablet 3    Sig: Take 1 tablet (25 mg total) by mouth daily.     Cardiovascular: Diuretics - Thiazide Failed - 06/09/2024 10:55 AM      Failed - Last BP in normal range    BP Readings from Last 1 Encounters:  01/14/24 (!) 144/82         Passed - Cr in normal range and within 180 days    Creat  Date Value Ref Range Status  01/14/2024 0.83 0.50 - 1.05 mg/dL Final         Passed - K in normal range and within 180 days    Potassium  Date Value Ref Range Status  01/14/2024 3.7 3.5 - 5.3 mmol/L Final         Passed - Na in normal range and within 180 days    Sodium  Date Value Ref Range Status  01/14/2024 141 135 - 146 mmol/L Final         Passed - Valid encounter within last 6 months    Recent Outpatient Visits           4 months ago Annual physical exam   [place] [facility] [place], Marsa PARAS, DO   7 months ago Epigastric abdominal pain   [place] [facility], Marsa PARAS, DO               levothyroxine  (SYNTHROID ) 50 MCG tablet 90 tablet 3    Sig: Take 1 tablet (50 mcg total) by mouth daily before breakfast.     Endocrinology:  Hypothyroid Agents Passed - 06/09/2024 10:55 AM      Passed - TSH in normal range and within 360 days    TSH  Date Value Ref Range Status  01/14/2024 0.89 0.40 - 4.50 mIU/L Final         Passed - Valid encounter within last 12 months    Recent Outpatient Visits           4 months ago Annual physical exam   [facility] [place] Marsa PARAS, [place]   7 months ago Epigastric abdominal pain   [place] [facility] [place], Marsa PARAS, [place]

## 2024-07-18 ENCOUNTER — Ambulatory Visit: Admitting: Family Medicine

## 2024-07-24 ENCOUNTER — Ambulatory Visit: Admitting: Family Medicine
